# Patient Record
Sex: Female | Born: 1970
Health system: Southern US, Community
[De-identification: ages and names within clinical notes are randomized; demographics above are authoritative.]

## PROBLEM LIST (undated history)

## (undated) DIAGNOSIS — R1013 Epigastric pain: Secondary | ICD-10-CM

## (undated) DIAGNOSIS — I8290 Acute embolism and thrombosis of unspecified vein: Secondary | ICD-10-CM

## (undated) DIAGNOSIS — F419 Anxiety disorder, unspecified: Secondary | ICD-10-CM

## (undated) DIAGNOSIS — R12 Heartburn: Secondary | ICD-10-CM

## (undated) DIAGNOSIS — D509 Iron deficiency anemia, unspecified: Secondary | ICD-10-CM

## (undated) DIAGNOSIS — K8020 Calculus of gallbladder without cholecystitis without obstruction: Secondary | ICD-10-CM

## (undated) DIAGNOSIS — F329 Major depressive disorder, single episode, unspecified: Secondary | ICD-10-CM

## (undated) DIAGNOSIS — F32A Depression, unspecified: Secondary | ICD-10-CM

## (undated) DIAGNOSIS — K219 Gastro-esophageal reflux disease without esophagitis: Secondary | ICD-10-CM

## (undated) DIAGNOSIS — I10 Essential (primary) hypertension: Secondary | ICD-10-CM

## (undated) DIAGNOSIS — R51 Headache: Secondary | ICD-10-CM

## (undated) HISTORY — DX: Gastro-esophageal reflux disease without esophagitis: K21.9

## (undated) HISTORY — PX: COLONOSCOPY: SHX174

## (undated) HISTORY — DX: Iron deficiency anemia, unspecified: D50.9

## (undated) HISTORY — PX: TUBAL LIGATION: SHX77

## (undated) HISTORY — DX: Epigastric pain: R10.13

## (undated) HISTORY — PX: OTHER SURGICAL HISTORY: SHX169

## (undated) HISTORY — DX: Calculus of gallbladder without cholecystitis without obstruction: K80.20

## (undated) HISTORY — DX: Heartburn: R12

---

## 1997-09-24 ENCOUNTER — Inpatient Hospital Stay (HOSPITAL_COMMUNITY): Admission: AD | Admit: 1997-09-24 | Discharge: 1997-09-27 | Payer: Self-pay | Admitting: Obstetrics & Gynecology

## 1998-11-06 ENCOUNTER — Ambulatory Visit (HOSPITAL_COMMUNITY): Admission: RE | Admit: 1998-11-06 | Discharge: 1998-11-06 | Payer: Self-pay | Admitting: *Deleted

## 1999-04-23 ENCOUNTER — Inpatient Hospital Stay (HOSPITAL_COMMUNITY): Admission: AD | Admit: 1999-04-23 | Discharge: 1999-04-23 | Payer: Self-pay | Admitting: *Deleted

## 1999-05-11 ENCOUNTER — Inpatient Hospital Stay (HOSPITAL_COMMUNITY): Admission: AD | Admit: 1999-05-11 | Discharge: 1999-05-13 | Payer: Self-pay | Admitting: *Deleted

## 1999-05-11 ENCOUNTER — Encounter (INDEPENDENT_AMBULATORY_CARE_PROVIDER_SITE_OTHER): Payer: Self-pay

## 1999-12-15 ENCOUNTER — Emergency Department (HOSPITAL_COMMUNITY): Admission: EM | Admit: 1999-12-15 | Discharge: 1999-12-15 | Payer: Self-pay | Admitting: Internal Medicine

## 2000-12-11 ENCOUNTER — Emergency Department (HOSPITAL_COMMUNITY): Admission: EM | Admit: 2000-12-11 | Discharge: 2000-12-11 | Payer: Self-pay | Admitting: Emergency Medicine

## 2002-11-12 ENCOUNTER — Inpatient Hospital Stay (HOSPITAL_COMMUNITY): Admission: EM | Admit: 2002-11-12 | Discharge: 2002-11-16 | Payer: Self-pay | Admitting: Emergency Medicine

## 2002-11-14 ENCOUNTER — Encounter (HOSPITAL_BASED_OUTPATIENT_CLINIC_OR_DEPARTMENT_OTHER): Payer: Self-pay | Admitting: Internal Medicine

## 2003-05-11 ENCOUNTER — Emergency Department (HOSPITAL_COMMUNITY): Admission: EM | Admit: 2003-05-11 | Discharge: 2003-05-11 | Payer: Self-pay

## 2003-08-06 ENCOUNTER — Ambulatory Visit (HOSPITAL_COMMUNITY): Admission: RE | Admit: 2003-08-06 | Discharge: 2003-08-06 | Payer: Self-pay | Admitting: Obstetrics & Gynecology

## 2003-10-07 ENCOUNTER — Emergency Department (HOSPITAL_COMMUNITY): Admission: EM | Admit: 2003-10-07 | Discharge: 2003-10-08 | Payer: Self-pay | Admitting: Emergency Medicine

## 2004-11-12 ENCOUNTER — Inpatient Hospital Stay (HOSPITAL_COMMUNITY): Admission: AD | Admit: 2004-11-12 | Discharge: 2004-11-13 | Payer: Self-pay | Admitting: Obstetrics

## 2004-11-15 ENCOUNTER — Inpatient Hospital Stay (HOSPITAL_COMMUNITY): Admission: AD | Admit: 2004-11-15 | Discharge: 2004-11-15 | Payer: Self-pay | Admitting: Obstetrics

## 2004-11-22 ENCOUNTER — Inpatient Hospital Stay (HOSPITAL_COMMUNITY): Admission: AD | Admit: 2004-11-22 | Discharge: 2004-11-22 | Payer: Self-pay | Admitting: Obstetrics

## 2004-11-25 ENCOUNTER — Inpatient Hospital Stay (HOSPITAL_COMMUNITY): Admission: AD | Admit: 2004-11-25 | Discharge: 2004-11-25 | Payer: Self-pay | Admitting: Obstetrics

## 2004-11-27 ENCOUNTER — Inpatient Hospital Stay (HOSPITAL_COMMUNITY): Admission: AD | Admit: 2004-11-27 | Discharge: 2004-11-29 | Payer: Self-pay | Admitting: Obstetrics

## 2005-03-10 ENCOUNTER — Ambulatory Visit (HOSPITAL_COMMUNITY): Admission: RE | Admit: 2005-03-10 | Discharge: 2005-03-10 | Payer: Self-pay | Admitting: Family Medicine

## 2005-10-30 ENCOUNTER — Inpatient Hospital Stay (HOSPITAL_COMMUNITY): Admission: AD | Admit: 2005-10-30 | Discharge: 2005-10-30 | Payer: Self-pay | Admitting: Obstetrics & Gynecology

## 2006-03-08 ENCOUNTER — Ambulatory Visit (HOSPITAL_COMMUNITY): Admission: RE | Admit: 2006-03-08 | Discharge: 2006-03-08 | Payer: Self-pay | Admitting: Obstetrics and Gynecology

## 2006-06-06 DIAGNOSIS — I8290 Acute embolism and thrombosis of unspecified vein: Secondary | ICD-10-CM

## 2006-06-06 HISTORY — DX: Acute embolism and thrombosis of unspecified vein: I82.90

## 2007-07-28 ENCOUNTER — Emergency Department (HOSPITAL_COMMUNITY): Admission: EM | Admit: 2007-07-28 | Discharge: 2007-07-28 | Payer: Self-pay | Admitting: Emergency Medicine

## 2008-05-07 ENCOUNTER — Emergency Department (HOSPITAL_COMMUNITY): Admission: EM | Admit: 2008-05-07 | Discharge: 2008-05-08 | Payer: Self-pay | Admitting: Emergency Medicine

## 2008-05-08 ENCOUNTER — Encounter (INDEPENDENT_AMBULATORY_CARE_PROVIDER_SITE_OTHER): Payer: Self-pay | Admitting: *Deleted

## 2008-06-06 HISTORY — PX: COLONOSCOPY: SHX174

## 2008-12-09 ENCOUNTER — Encounter (INDEPENDENT_AMBULATORY_CARE_PROVIDER_SITE_OTHER): Payer: Self-pay | Admitting: *Deleted

## 2008-12-09 ENCOUNTER — Emergency Department (HOSPITAL_COMMUNITY): Admission: EM | Admit: 2008-12-09 | Discharge: 2008-12-09 | Payer: Self-pay | Admitting: Emergency Medicine

## 2008-12-19 ENCOUNTER — Emergency Department (HOSPITAL_COMMUNITY): Admission: EM | Admit: 2008-12-19 | Discharge: 2008-12-19 | Payer: Self-pay | Admitting: Emergency Medicine

## 2009-04-08 ENCOUNTER — Encounter (INDEPENDENT_AMBULATORY_CARE_PROVIDER_SITE_OTHER): Payer: Self-pay | Admitting: *Deleted

## 2009-04-17 ENCOUNTER — Ambulatory Visit: Payer: Self-pay | Admitting: Gastroenterology

## 2009-04-17 DIAGNOSIS — K219 Gastro-esophageal reflux disease without esophagitis: Secondary | ICD-10-CM | POA: Insufficient documentation

## 2009-04-23 ENCOUNTER — Telehealth: Payer: Self-pay | Admitting: Gastroenterology

## 2009-04-27 ENCOUNTER — Ambulatory Visit: Payer: Self-pay | Admitting: Gastroenterology

## 2009-05-01 ENCOUNTER — Telehealth: Payer: Self-pay | Admitting: Gastroenterology

## 2009-05-06 ENCOUNTER — Encounter: Payer: Self-pay | Admitting: Gastroenterology

## 2009-05-19 ENCOUNTER — Ambulatory Visit: Payer: Self-pay | Admitting: Gastroenterology

## 2009-06-29 ENCOUNTER — Emergency Department (HOSPITAL_COMMUNITY): Admission: EM | Admit: 2009-06-29 | Discharge: 2009-06-30 | Payer: Self-pay | Admitting: Emergency Medicine

## 2009-07-21 ENCOUNTER — Telehealth: Payer: Self-pay | Admitting: Gastroenterology

## 2009-07-22 ENCOUNTER — Ambulatory Visit: Payer: Self-pay | Admitting: Gastroenterology

## 2009-07-22 DIAGNOSIS — R1012 Left upper quadrant pain: Secondary | ICD-10-CM | POA: Insufficient documentation

## 2009-08-19 ENCOUNTER — Inpatient Hospital Stay (HOSPITAL_COMMUNITY): Admission: AD | Admit: 2009-08-19 | Discharge: 2009-08-19 | Payer: Self-pay | Admitting: Obstetrics & Gynecology

## 2009-10-19 ENCOUNTER — Telehealth: Payer: Self-pay | Admitting: Gastroenterology

## 2009-10-22 ENCOUNTER — Ambulatory Visit: Payer: Self-pay | Admitting: Gastroenterology

## 2009-10-22 DIAGNOSIS — D509 Iron deficiency anemia, unspecified: Secondary | ICD-10-CM | POA: Insufficient documentation

## 2009-10-23 ENCOUNTER — Telehealth: Payer: Self-pay | Admitting: Gastroenterology

## 2009-10-28 ENCOUNTER — Ambulatory Visit (HOSPITAL_COMMUNITY): Admission: RE | Admit: 2009-10-28 | Discharge: 2009-10-28 | Payer: Self-pay | Admitting: Gastroenterology

## 2009-11-03 ENCOUNTER — Ambulatory Visit: Payer: Self-pay | Admitting: Gastroenterology

## 2009-11-18 ENCOUNTER — Telehealth: Payer: Self-pay | Admitting: Gastroenterology

## 2009-11-25 ENCOUNTER — Telehealth: Payer: Self-pay | Admitting: Gastroenterology

## 2010-06-09 ENCOUNTER — Ambulatory Visit (HOSPITAL_COMMUNITY)
Admission: RE | Admit: 2010-06-09 | Discharge: 2010-06-09 | Payer: Self-pay | Source: Home / Self Care | Attending: Nephrology | Admitting: Nephrology

## 2010-06-23 ENCOUNTER — Telehealth: Payer: Self-pay | Admitting: Gastroenterology

## 2010-06-23 ENCOUNTER — Ambulatory Visit: Admit: 2010-06-23 | Payer: Self-pay | Admitting: Gastroenterology

## 2010-07-06 NOTE — Progress Notes (Signed)
Summary: TRIAGE-LUQ Pain/GERD  Phone Note Call from Patient Call back at Home Phone [phone]   Caller: Patient Call For: Arlyce Dice Reason for Call: Talk to Nurse Summary of Call: Patient has alot of pain on her left side under her breast, thinks that she might have gallstone wants to know what to do. Initial call taken by: Tawni Levy,  July 21, 2009 9:52 AM  Follow-up for Phone Call        Pt. c/o intermittent LUQ pain, gets worse at times. Denies n/v, fever. Takes Nexium daily, has heartburn daily, seems to be getting worse. Pt. will see Dr.Kaoir Loree on 07-22-09 at 8:30am.  Follow-up by: Laureen Ochs LPN,  July 21, 2009 10:07 AM

## 2010-07-06 NOTE — Progress Notes (Signed)
Summary: TRIAGE-Med Change  Phone Note Call from Patient Call back at Home Phone [phone]   Caller: Patient Call For: Dr. Arlyce Dice Reason for Call: Talk to Nurse Summary of Call: pt is wanting to discuss her Nexium med....and maybe changing the med to an alternative Initial call taken by: Karna Christmas,  November 25, 2009 10:51 AM  Follow-up for Phone Call        Message left for patient to callback. Laureen Ochs LPN  November 25, 2009 12:53 PM  Pt. states her insurance won't cover Nexium any longer, Omeprazole 40mg  once daily sent to her pharmacy.  Pt. instructed to call back as needed.  Follow-up by: Laureen Ochs LPN,  November 25, 2009 1:06 PM  Additional Follow-up for Phone Call Additional follow up Details #1::        ok Additional Follow-up by: Louis Meckel MD,  November 26, 2009 10:19 AM    New/Updated Medications: OMEPRAZOLE 40 MG  CPDR (OMEPRAZOLE) 1 each day 30 minutes before breakfast. Prescriptions: OMEPRAZOLE 40 MG  CPDR (OMEPRAZOLE) 1 each day 30 minutes before breakfast.  #30 x 12   Entered by:   Laureen Ochs LPN   Authorized by:   Louis Meckel MD   Signed by:   Laureen Ochs LPN on 09/81/1914   Method used:   [facility]  [address] [phone]* (retail)       [address]       [place], [place]  [address]       Ph: [phone]       Fax: [phone]   RxID:   [id number]

## 2010-07-06 NOTE — Progress Notes (Signed)
Summary: TRIAGE  Phone Note Call from Patient Call back at Home Phone [phone]   Caller: Patient Call For: Arlyce Dice Reason for Call: Talk to Nurse Summary of Call: Patient states that she has a lot of chest pain and burning and states that Nexium is not helping, wants to be seen before frist available appt 6-15. Initial call taken by: Tawni Levy,  Oct 19, 2009 10:42 AM  Follow-up for Phone Call        Last OV 07-22-09. Takes Nexium 2 QAM. C/O increasing GERD and burning, worse at night.  Was seen in the ER last month and was given Hyoscyamine 0.125mg  as needed, helps somewhat. Wants to be ASAP.  1) See Dr.Kaplan on 10-22-09 at 9:30am 2) Take Nexium two times a day until appt., not at the same time.  3) Continue Hyomax 4) Soft,bland diet. No spicy,greasy,fried foods.  5) Pt. instructed to call back as needed.  Follow-up by: Laureen Ochs LPN,  Oct 19, 2009 11:15 AM

## 2010-07-06 NOTE — Assessment & Plan Note (Signed)
Summary: WORSENING LUQ PAIN AND GERD             [name]   History of Present Illness Visit Type: Follow-up Visit Primary GI MD: Melvia Heaps MD [facility] Primary Provider: n/a Requesting Provider: n/a Chief Complaint: Worsened LUQ pain and GERD, pt states she sometimes will take the Nexium bid but it seems that Alka Seltzer plus helps more than anything History of Present Illness:   [name] left upper quadrant pain. she has developed aching left upper quadrant pain without radiation that  may occur in the evenings.  Extra Nexium did not help.  Upper endoscopy demonstrated mild esophagitis.  Colonoscopy was negative.   GI Review of Systems    Reports abdominal pain and  acid reflux.     Location of  Abdominal pain: LUQ.    Denies belching, bloating, chest pain, dysphagia with liquids, dysphagia with solids, heartburn, loss of appetite, nausea, vomiting, vomiting blood, weight loss, and  weight gain.        Denies anal fissure, black tarry stools, change in bowel habit, constipation, diarrhea, diverticulosis, fecal incontinence, heme positive stool, hemorrhoids, irritable bowel syndrome, jaundice, light color stool, liver problems, rectal bleeding, and  rectal pain.    Current Medications (verified): 1)  Nexium 40 Mg Capdr (Esomeprazole Magnesium) .... Take One (1) By Mouth Once A Day 2)  Alka-Seltzer Extra Strength 500-1-1.985 Mg-Gm-Gm Tbef (Aspirin Effervescent) .... As Needed  Allergies (verified): No Known Drug Allergies  Past History:  Past Medical History: Last updated: 04/17/2009 Epigastric Abdominal Pain Heartburn  Past Surgical History: Last updated: 04/17/2009 unremarkable  Family History: Last updated: 04/17/2009 Family History of Diabetes: Mother Family History of Colon Cancer: Father???  Social History: Last updated: 04/17/2009 Patient has never smoked. Daily Caffeine Use-1 drink daily Illicit Drug Use - no Patient does not get regular exercise.  Alcohol  Use - yes-very occasional  Review of Systems  The patient denies allergy/sinus, anemia, anxiety-new, arthritis/joint pain, back pain, blood in urine, breast changes/lumps, change in vision, confusion, cough, coughing up blood, depression-new, fainting, fatigue, fever, headaches-new, hearing problems, heart murmur, heart rhythm changes, itching, menstrual pain, muscle pains/cramps, night sweats, nosebleeds, pregnancy symptoms, shortness of breath, skin rash, sleeping problems, sore throat, swelling of feet/legs, swollen lymph glands, thirst - excessive , urination - excessive , urination changes/pain, urine leakage, vision changes, and voice change.         Right Shoulder pain URI  Vital Signs:  Patient profile:   40 year old female Height:      63 inches Weight:      201 pounds BMI:     35.73 BSA:     1.94 Pulse rate:   88 / minute Pulse rhythm:   regular BP sitting:   118 / 74  (left arm)  Vitals Entered By: Merri Ray CMA Duncan Dull) (July 22, 2009 8:50 AM)   Impression & Recommendations:  Problem # 1:  ABDOMINAL PAIN, LEFT UPPER QUADRANT (ICD-789.02) Symptoms are not specific.  Recommendations #1 polyp hyomax sublingual p.r.n. #2 continue Nexium  Problem # 2:  FM HX MALIGNANT NEOPLASM GASTROINTESTINAL TRACT (ICD-V16.0) Plan repeat colonoscopy in 5 years  Problem # 3:  ESOPHAGEAL REFLUX (ICD-530.81) Assessment: Improved  Appended Document: WORSENING LUQ PAIN AND GERD             [name]    Clinical Lists Changes  Medications: Added new medication of HYOMAX-SL 0.125 MG SUBL (HYOSCYAMINE SULFATE) 2 SL every 4 hours as needed - Signed Rx  of HYOMAX-SL 0.125 MG SUBL (HYOSCYAMINE SULFATE) 2 SL every 4 hours as needed;  #25 x 2;  Signed;  Entered by: Merri Ray CMA (AAMA);  Authorized by: Louis Meckel MD;  Method used: [facility] [phone]*, [address] [place], [place], [place]  [address], Ph: [phone], Fax:  [phone]    Prescriptions: HYOMAX-SL 0.125 MG SUBL (HYOSCYAMINE SULFATE) 2 SL every 4 hours as needed  #25 x 2   Entered by:   Merri Ray CMA (AAMA)   Authorized by:   Louis Meckel MD   Signed by:   Merri Ray CMA (AAMA) on 07/22/2009   Method used:   [facility]        [facility]  [address] [phone]* (retail)       [address]       [place], [place]  [address]       Ph: [phone]       Fax: [phone]   RxID:   [id number]

## 2010-07-06 NOTE — Assessment & Plan Note (Signed)
Summary: WORSENING GERD                   [name]   History of Present Illness Visit Type: Follow-up Visit Primary GI MD: Melvia Heaps MD [facility] Primary Provider: na Requesting Provider: n/a Chief Complaint: Worsening GERD History of Present Illness:   [name] has returned for followup of her abdominal pain.  She is having very frequent episodes of moderately severe aching upper abdominal pain that tends to occur on the left breast and in the subxiphoid area.  Pain is without radiation.  It is not necessarily related to eating.  She is taking Nexium daily.  She reports some improvement in her pain she takes up to 3 sublingual hyomax at a time.  He is without nausea, pyrosis or vomiting.  She was seen was along DR in March, 2000 and because of pain.  Lab was pertinent for hemoglobin of 6.5 and MCV of 55.  She denies menorrhagia.  Distal history of melena or hematochezia.  Liver tests are normal.  A vaginal ultrasound demonstrated prominent follicles.  Pregnancy test was negative.  She is on no gastric irritants.   GI Review of Systems    Reports acid reflux and  heartburn.      Denies abdominal pain, belching, bloating, chest pain, dysphagia with liquids, dysphagia with solids, loss of appetite, nausea, vomiting, vomiting blood, weight loss, and  weight gain.        Denies anal fissure, black tarry stools, change in bowel habit, constipation, diarrhea, diverticulosis, fecal incontinence, heme positive stool, hemorrhoids, irritable bowel syndrome, jaundice, light color stool, liver problems, rectal bleeding, and  rectal pain.    Current Medications (verified): 1)  Nexium 40 Mg Capdr (Esomeprazole Magnesium) .... Take One (1) By Mouth Once A Day 2)  Alka-Seltzer Extra Strength 500-1-1.985 Mg-Gm-Gm Tbef (Aspirin Effervescent) .... As Needed 3)  Hyomax-Sl 0.125 Mg Subl (Hyoscyamine Sulfate) .... 2 Sl Every 4 Hours As Needed  Allergies (verified): No Known Drug Allergies  Past  History:  Past Medical History: Reviewed history from 04/17/2009 and no changes required. Epigastric Abdominal Pain Heartburn  Past Surgical History: Reviewed history from 04/17/2009 and no changes required. unremarkable  Family History: Reviewed history from 04/17/2009 and no changes required. Family History of Diabetes: Mother Family History of Colon Cancer: Father???  Social History: Medication Aid---CNA  Married  Patient has never smoked. Daily Caffeine Use-1 drink daily Illicit Drug Use - no Patient does not get regular exercise.  Alcohol Use - yes-very occasional  Review of Systems       The patient complains of shortness of breath.  The patient denies allergy/sinus, anemia, anxiety-new, arthritis/joint pain, back pain, blood in urine, breast changes/lumps, change in vision, confusion, cough, coughing up blood, depression-new, fainting, fatigue, fever, headaches-new, hearing problems, heart murmur, heart rhythm changes, itching, menstrual pain, muscle pains/cramps, night sweats, nosebleeds, pregnancy symptoms, skin rash, sleeping problems, sore throat, swelling of feet/legs, swollen lymph glands, thirst - excessive , urination - excessive , urination changes/pain, urine leakage, vision changes, and voice change.    Vital Signs:  Patient profile:   40 year old female Height:      63 inches Weight:      197 pounds BMI:     35.02 BSA:     1.92 Pulse rate:   96 / minute Pulse rhythm:   regular BP sitting:   122 / 76  (left arm) Cuff size:   large  Vitals Entered By: Ok Anis  CMA (Oct 22, 2009 10:09 AM)  Physical Exam  Additional Exam:  On physical exam she is a well-developed well-nourished female  On abdominal exam there are no abdominal masses, tenderness, organomegaly   Impression & Recommendations:  Problem # 1:  ABDOMINAL PAIN, LEFT UPPER QUADRANT (ICD-789.02)  Etiology of her pain is still uncertain.  I think it is unlikely that it is due to reflux.   She seems to respond, at least in part, to anticholinergics.  Esophageal spasm and, less likely, chronic cholecystitis are possibilities.  Recommendations #1 abdominal ultrasound #2 switch hyomax 2.375 mg b.i.d. #3 to consider hepatobiliary scan pending results of ultrasound #4 possible suction manometry pending results of the above  Orders: Ultrasound Abdomen (UAS)  Problem # 2:  IRON DEFICIENCY (ICD-280.9)  While this could be due to chronic menstrual blood loss, occult GI bleeding needs to be ruled out.  Conditions #1 stool Hemoccults #2 begin iron supplementation  Orders: Ultrasound Abdomen (UAS)  Patient Instructions: 1)  Your Abdominal U/S is scheduled for 10/28/2009 at 9am at [facility] 2)  We are giving you printed prescriptions today 3)  Begin Iron Supplements 4)  The medication list was reviewed and reconciled.  All changed / newly prescribed medications were explained.  A complete medication list was provided to the patient / caregiver. Prescriptions: FEOSOL 200 (65 FE) MG TABS (FERROUS SULFATE DRIED) takes one tab b.i.d.  #60 x 2   Entered by:   Merri Ray CMA (AAMA)   Authorized by:   Louis Meckel MD   Signed by:   Merri Ray CMA (AAMA) on 10/22/2009   Method used:   Faxed to ...       [facility] (retail)       [phone] [address]       [place], [place]  [address]       Ph: [phone]       Fax: [phone]   RxID:   [id number] HYOMAX-SR 0.375 MG XR12H-TAB (HYOSCYAMINE SULFATE) take one tab b.i.d.  #40 x 2   Entered by:   Merri Ray CMA (AAMA)   Authorized by:   Louis Meckel MD   Signed by:   Merri Ray CMA (AAMA) on 10/22/2009   Method used:   Faxed to ...       [facility] (retail)       [id number]       [place], [place]  [address]       Ph: [phone]       Fax: [phone]   RxID:   [id number]

## 2010-07-06 NOTE — Letter (Signed)
Summary: [facility]: Immunoassay Fecal Occult Blood (iFOB) Order [facility]  [address] [place], [place] [address]   Phone: [phone]  Fax: [phone]      [place] [facility]: Immunoassay Fecal Occult Blood (iFOB) Order Form   Oct 22, 2009 MRN: [id number]   [facility] 04-Sep-1970   Physicican Name:robert kaplan  Diagnosis Code:anemia  280.9      Merri Ray CMA (AAMA)

## 2010-07-06 NOTE — Progress Notes (Signed)
Summary: Condition Update  Phone Note Call from Patient Call back at Home Phone [phone]   Caller: Patient Call For: Dr. Arlyce Dice Reason for Call: Lab or Test Results Summary of Call: pt is calling about her ultrasound results Initial call taken by: Karna Christmas,  November 18, 2009 12:30 PM  Follow-up for Phone Call        Ultrasound results reviewed w/pt. Pt. states her pain has almost resolved, she continues Hyomax two times a day and Nexium once daily.  Pt. advised to call back for any changes or worsening symptoms.  DR.Sterlin Knightly-Any further orders? When does she need to see you again?  Follow-up by: Laureen Ochs LPN,  November 18, 2009 1:39 PM  Additional Follow-up for Phone Call Additional follow up Details #1::        OV as needed if she continues to respond to hyomax. Additional Follow-up by: Louis Meckel MD,  November 18, 2009 4:07 PM    Additional Follow-up for Phone Call Additional follow up Details #2::    Message left for pt. with above MD instructions. Pt. instructed to call back as needed.  Follow-up by: Laureen Ochs LPN,  November 18, 2009 4:27 PM

## 2010-07-06 NOTE — Progress Notes (Signed)
Summary: med request  Phone Note Call from Patient Call back at Home Phone [phone]   Caller: Patient Call For: Dr. Arlyce Dice Reason for Call: Refill Medication Summary of Call: states that Hyomax and iron pills [facility] not at [facility] [address] Initial call taken by: Vallarie Mare,  Oct 23, 2009 2:35 PM  Follow-up for Phone Call        Called pt to inform that [facility] [address] L/M on pts answering machine Follow-up by: Merri Ray CMA Duncan Dull),  Oct 23, 2009 3:01 PM    Prescriptions: FEOSOL 200 (65 FE) MG TABS (FERROUS SULFATE DRIED) takes one tab b.i.d.  #60 x 2   Entered by:   Merri Ray CMA (AAMA)   Authorized by:   Louis Meckel MD   Signed by:   Merri Ray CMA (AAMA) on 10/23/2009   Method used:   [facility]        [facility]  [address] [phone]* (retail)       [address]       [place], [place]  [address]       Ph: [phone]       Fax: [phone]   RxID:   [id number] HYOMAX-SR 0.375 MG XR12H-TAB (HYOSCYAMINE SULFATE) take one tab b.i.d.  #60 x 2   Entered by:   Merri Ray CMA (AAMA)   Authorized by:   Louis Meckel MD   Signed by:   Merri Ray CMA (AAMA) on 10/23/2009   Method used:   [facility]        [facility]  [address] [phone]* (retail)       [address]       [place], [place]  [address]       Ph: [phone]       Fax: [phone]   RxID:   [id number]

## 2010-07-08 NOTE — Progress Notes (Signed)
Summary: Triage  Phone Note Call from Patient Call back at Home Phone [phone]   Caller: Patient Call For: Dr. Arlyce Dice Reason for Call: Talk to Nurse Summary of Call: Pt says she is hurting and medication is not helping, wants to be seen ASAP but does not want to miss work so she wants [place] to work her in  Initial call taken by: [place] Johnson,  June 23, 2010 10:08 AM  Follow-up for Phone Call        Pt called to let us know that she can not make it today to see PA at 1:30, just letting us know Follow-up by: [place] Johnson,  June 23, 2010 12:38 PM  Additional Follow-up for Phone Call Additional follow up Details #1::        Patient states she will call back to schedule an appointment. Additional Follow-up by: Selinda Michaels RN,  June 23, 2010 1:00 PM

## 2010-08-08 ENCOUNTER — Emergency Department (HOSPITAL_COMMUNITY)
Admission: EM | Admit: 2010-08-08 | Discharge: 2010-08-08 | Disposition: A | Discharge status: Home / Self Care | Payer: BC Managed Care – PPO | Attending: Emergency Medicine | Admitting: Emergency Medicine

## 2010-08-08 DIAGNOSIS — N390 Urinary tract infection, site not specified: Secondary | ICD-10-CM | POA: Insufficient documentation

## 2010-08-08 DIAGNOSIS — D649 Anemia, unspecified: Secondary | ICD-10-CM | POA: Insufficient documentation

## 2010-08-08 LAB — DIFFERENTIAL
Eosinophils Absolute: 0.1 10*3/uL (ref 0.0–0.7)
Eosinophils Relative: 1 % (ref 0–5)
Monocytes Absolute: 0.7 10*3/uL (ref 0.1–1.0)
Neutrophils Relative %: 63 % (ref 43–77)

## 2010-08-08 LAB — CBC
Platelets: 427 10*3/uL — ABNORMAL HIGH (ref 150–400)
RDW: 19.6 % — ABNORMAL HIGH (ref 11.5–15.5)
WBC: 10.5 10*3/uL (ref 4.0–10.5)

## 2010-08-08 LAB — URINALYSIS, ROUTINE W REFLEX MICROSCOPIC
Bilirubin Urine: NEGATIVE
Ketones, ur: NEGATIVE mg/dL
Nitrite: NEGATIVE
pH: 6 (ref 5.0–8.0)

## 2010-08-08 LAB — URINE MICROSCOPIC-ADD ON

## 2010-08-08 LAB — BASIC METABOLIC PANEL
GFR calc Af Amer: >60 mL/min (ref >60)
GFR calc non Af Amer: >60 mL/min (ref >60)
Potassium: 3.9 mEq/L (ref 3.5–5.1)
Sodium: 136 mEq/L (ref 135–145)

## 2010-08-29 LAB — URINALYSIS, ROUTINE W REFLEX MICROSCOPIC
Nitrite: NEGATIVE
Specific Gravity, Urine: 1.01 (ref 1.005–1.030)
pH: 7 (ref 5.0–8.0)

## 2010-08-29 LAB — COMPREHENSIVE METABOLIC PANEL
ALT: 13 U/L (ref 0–35)
AST: 19 U/L (ref 0–37)
Albumin: 3.6 g/dL (ref 3.5–5.2)
CO2: 23 mEq/L (ref 19–32)
Calcium: 8.9 mg/dL (ref 8.4–10.5)
Chloride: 103 mEq/L (ref 96–112)
GFR calc Af Amer: >60 mL/min (ref >60)
GFR calc non Af Amer: >60 mL/min (ref >60)
Sodium: 133 mEq/L — ABNORMAL LOW (ref 135–145)

## 2010-08-29 LAB — CBC
MCHC: 29.3 g/dL — ABNORMAL LOW (ref 30.0–36.0)
Platelets: 309 10*3/uL (ref 150–400)
RBC: 4.05 MIL/uL (ref 3.87–5.11)
WBC: 8.8 10*3/uL (ref 4.0–10.5)

## 2010-09-12 LAB — CULTURE, ROUTINE-ABSCESS: Gram Stain: NONE SEEN

## 2010-09-12 LAB — POCT I-STAT, CHEM 8
Calcium, Ion: 1.12 mmol/L (ref 1.12–1.32)
Hemoglobin: 9.9 g/dL — ABNORMAL LOW (ref 12.0–15.0)
Sodium: 138 mEq/L (ref 135–145)
TCO2: 23 mmol/L (ref 0–100)

## 2010-09-12 LAB — URINE MICROSCOPIC-ADD ON

## 2010-09-12 LAB — URINALYSIS, ROUTINE W REFLEX MICROSCOPIC
Glucose, UA: NEGATIVE mg/dL
Specific Gravity, Urine: 1.015 (ref 1.005–1.030)
pH: 5 (ref 5.0–8.0)

## 2010-09-12 LAB — PREGNANCY, URINE: Preg Test, Ur: NEGATIVE

## 2010-09-12 LAB — KETONES, QUALITATIVE: Acetone, Bld: NEGATIVE

## 2010-09-27 ENCOUNTER — Other Ambulatory Visit: Payer: Self-pay | Admitting: Gastroenterology

## 2010-09-29 ENCOUNTER — Telehealth: Payer: Self-pay | Admitting: Gastroenterology

## 2010-09-29 NOTE — Telephone Encounter (Signed)
Left message for pt to call back  °

## 2010-10-01 NOTE — Telephone Encounter (Signed)
Pt states she is having trouble with epigastric pain, she reports the pain is right under her rib cage. She wonders if she has a hiatal hernia. Would like to be seen. Appt given for pt to see Amy Esterwood PA 10/04/10@1 :30pm. Pt aware of appt date and time.

## 2010-10-04 ENCOUNTER — Other Ambulatory Visit (INDEPENDENT_AMBULATORY_CARE_PROVIDER_SITE_OTHER): Payer: BC Managed Care – PPO

## 2010-10-04 ENCOUNTER — Encounter: Payer: Self-pay | Admitting: Physician Assistant

## 2010-10-04 ENCOUNTER — Ambulatory Visit (INDEPENDENT_AMBULATORY_CARE_PROVIDER_SITE_OTHER): Payer: BC Managed Care – PPO | Admitting: Physician Assistant

## 2010-10-04 VITALS — BP 128/76 | HR 88 | Ht 62.0 in | Wt 186.0 lb

## 2010-10-04 DIAGNOSIS — R1013 Epigastric pain: Secondary | ICD-10-CM

## 2010-10-04 DIAGNOSIS — R1011 Right upper quadrant pain: Secondary | ICD-10-CM

## 2010-10-04 DIAGNOSIS — K219 Gastro-esophageal reflux disease without esophagitis: Secondary | ICD-10-CM

## 2010-10-04 DIAGNOSIS — K8020 Calculus of gallbladder without cholecystitis without obstruction: Secondary | ICD-10-CM

## 2010-10-04 LAB — HEPATIC FUNCTION PANEL
AST: 17 U/L (ref 0–37)
Albumin: 3.3 g/dL — ABNORMAL LOW (ref 3.5–5.2)
Total Bilirubin: 0.6 mg/dL (ref 0.3–1.2)

## 2010-10-04 NOTE — Progress Notes (Signed)
  Subjective:    Patient ID: [name], female    DOB: [birth year], 40 y.o.   MRN: [id number]  HPI [name] is a pleasant 40 year old African American female known to Dr. Arlyce Dice who has a diagnosis of GERD. She also had colonoscopy in 2010 which was a normal exam. She was last seen in January of 2012 at that time complaining of reflux symptoms and left upper quadrant pain. She has been on omeprazole 40 mg daily and has been using high max as needed.  She comes in today with complaints of persistent intermittent epigastric and right upper quadrant pain. She says this is the same symptoms that she's been having over the past several months. Her pain is not constant constant but rather comes and goes and often awakens her at night. She says when she gets the pain at night she may be up for a couple of hours. She describes it as sharp and nonradiating not associated with any nausea or vomiting. She is using ibuprofen for this pain as needed and also uses hyomax which she feels that does help eventually. She is not having any fever or chills says that her bowel movements are normal. She also has episodes during the day of epigastric pain usually exacerbated by chocolate ,cheese and greasy foods. Appetite is times he feels that she's having episodes of pain 3-4 times weekly. She also has some heartburn and indigestion denies any dysphagia or odynophagia, she has lost a little weight but this has been intentional.  A review of a prior workup shows abdominal ultrasound done in May of 2011 showing several small gallstones common bile duct of 6 mm. She had an EGD in November of 2010 pertinent for mild reflux esophagitis and gastritis H. pylori negative.    Review of Systems  Constitutional: Negative.   Eyes: Negative.   Respiratory: Negative.   Cardiovascular: Negative.   Gastrointestinal: Positive for abdominal pain.  Genitourinary: Negative.   Musculoskeletal: Negative.   Skin: Negative.     Neurological: Negative.   Hematological: Negative.   Psychiatric/Behavioral: Negative.        Objective:   Physical Exam Well-developed African American female in no acute distress pleasant Alert and oriented x3,  HEENT ;nontraumatic normocephalic EOMI PERRLA sclera anicteric  Neck; supple no JVD  Cardiovascular; regular rate and rhythm with S1-S2 no murmur rub or gallop  Pulmonary; clear bilaterally  Abdomen; soft, nontender, bowel sounds active, no palpable masses or hepatosplenomegaly  Recta;l not done  Skin; warm and dry benign  Psych; mood and affect normal and appropriate        Assessment & Plan:  #16 40 year old female with recurrent frequent episodes of epigastric/right upper quadrant pain times several months, no change with current PPI therapy Suspect her symptoms may be due to symptomatic cholelithiasis/biliary colic.  Plan; Continue omeprazole 40 mg but increase to twice daily dosing in the short-term Check hepatic panel today, patient had an episode of pain  24 hours ago Repeat upper abdominal ultrasound to assess common bile duct wall thickness etc. Discussed surgical referral for laparoscopic cholecystectomy, patient would like to wait for the ultrasound results prior to scheduling.

## 2010-10-04 NOTE — Patient Instructions (Signed)
Please go to the basement level to have your labs drawn.  We have scheduled the Ultrasound at [facility]. Directions and date and time provided. Take the Omeprazole ( Prilosec) twice daily for 14 days then go back to once daily. We will call you with the lab and ultrasound results.

## 2010-10-05 ENCOUNTER — Telehealth: Payer: Self-pay | Admitting: *Deleted

## 2010-10-05 NOTE — Telephone Encounter (Signed)
Patient given lab results as per Amy Esterwood, PA 

## 2010-10-05 NOTE — Telephone Encounter (Signed)
Message copied by Jesse Fall on Tue Oct 05, 2010  2:02 PM ------      Message from: [place], [place]      Created: Tue Oct 05, 2010  1:31 PM       Please let pt know her labs are normal

## 2010-10-06 NOTE — Progress Notes (Signed)
I agree with the management per Ms. Esterwood.  Iva Boop, MD, [facility]

## 2010-10-07 ENCOUNTER — Ambulatory Visit (HOSPITAL_COMMUNITY)
Admission: RE | Admit: 2010-10-07 | Discharge: 2010-10-07 | Disposition: A | Discharge status: Home / Self Care | Payer: BC Managed Care – PPO | Source: Ambulatory Visit | Attending: Physician Assistant | Admitting: Physician Assistant

## 2010-10-07 DIAGNOSIS — R110 Nausea: Secondary | ICD-10-CM | POA: Insufficient documentation

## 2010-10-07 DIAGNOSIS — R1011 Right upper quadrant pain: Secondary | ICD-10-CM

## 2010-10-07 DIAGNOSIS — K8020 Calculus of gallbladder without cholecystitis without obstruction: Secondary | ICD-10-CM | POA: Insufficient documentation

## 2010-10-07 DIAGNOSIS — K219 Gastro-esophageal reflux disease without esophagitis: Secondary | ICD-10-CM

## 2010-10-07 DIAGNOSIS — R109 Unspecified abdominal pain: Secondary | ICD-10-CM | POA: Insufficient documentation

## 2010-10-07 DIAGNOSIS — R1013 Epigastric pain: Secondary | ICD-10-CM

## 2010-10-08 ENCOUNTER — Telehealth: Payer: Self-pay

## 2010-10-08 NOTE — Telephone Encounter (Signed)
Spoke with pt and she is aware of results per Mike Gip PA. Called [facility] and left message for [facility] to call back about referral for lap chole.

## 2010-10-08 NOTE — Telephone Encounter (Signed)
Appt made for pt to see Dr. Luretha Murphy 10/14/10. Arrival time 1015, appt time 10:35am. Pt aware of appt date and time but wanted to know if we could do anything on a Wed. Pt given the office phone number for Dr. Daphine Deutscher to see if she could change her appt date. Records faxed to Dr. Daphine Deutscher.

## 2010-10-08 NOTE — Telephone Encounter (Signed)
Message copied by Chrystie Nose on Fri Oct 08, 2010  9:05 AM ------      Message from: [place], [place]      Created: Thu Oct 07, 2010  4:41 PM       Please let pt know her US shows multiple gallstones,slightly dilated bile duct. I  Think she needs surgical referral for lap chole. PLEASE SCHEDULE IF PT OK GOING AHEAD. WE DISCUSSED AT HER OFFICE VISIT, AND SHE WANTED TO WAIT TO SEE WHAT THE US SHOWED., THANKS AMY

## 2010-10-18 ENCOUNTER — Encounter (HOSPITAL_COMMUNITY): Payer: BC Managed Care – PPO

## 2010-10-21 ENCOUNTER — Ambulatory Visit (HOSPITAL_COMMUNITY)
Admission: RE | Admit: 2010-10-21 | Discharge: 2010-10-21 | Disposition: A | Discharge status: Home / Self Care | Payer: BC Managed Care – PPO | Source: Ambulatory Visit | Attending: Surgery | Admitting: Surgery

## 2010-10-21 DIAGNOSIS — K8020 Calculus of gallbladder without cholecystitis without obstruction: Secondary | ICD-10-CM | POA: Insufficient documentation

## 2010-10-21 DIAGNOSIS — Z01810 Encounter for preprocedural cardiovascular examination: Secondary | ICD-10-CM | POA: Insufficient documentation

## 2010-10-21 DIAGNOSIS — I803 Phlebitis and thrombophlebitis of lower extremities, unspecified: Secondary | ICD-10-CM

## 2010-10-21 DIAGNOSIS — Z86718 Personal history of other venous thrombosis and embolism: Secondary | ICD-10-CM

## 2010-10-22 NOTE — H&P (Signed)
[facility]  PatientSYLVANNA Solomon                      MRN: [id number] Proc. Date: 05/12/99 Adm. Date:  [id number] Attending:  Deniece Ree                         History and Physical  HISTORY OF PRESENT ILLNESS:   The patient is a 40 year old, gravida 3, para 3, status post normal spontaneous vaginal delivery of a female infant.  The patient has been desirous of permanent sterilization throughout this pregnancy.  All of her questions were answered to her satisfaction.  Other types of contraceptives methods that are reversible have been given to this patient who does not desire to continue in that route.  She understands the possible complications.  She also understands that this procedure is intended to be permanent, however, cannot be guaranteed.  PHYSICAL EXAMINATION:  GENERAL:                      Revealed a well-developed, well-nourished postpartum female in no acute distress.  HEENT:                        Within normal limits.  NECK:                         Supple.  BREASTS:                      Without masses, tenderness, or discharge.  LUNGS:                        Clear to auscultation and percussion.  HEART:                        Normal sinus rhythm without murmurs, rubs, or gallops.  ABDOMEN:                      Benign with uterine fundus extending to the umbilicus.  EXTREMITIES: NEUROLOGICAL:    Within normal limits.  PELVIC:                       Not done.  ADMISSION DIAGNOSES:          Multiparity, desirous of permanent sterilization.  PLAN:                         Bilateral tubal ligation using the modified Pomeroy procedure. DD:  05/12/99 TD:  05/13/99 Job: [id number]

## 2010-10-22 NOTE — Discharge Summary (Signed)
[name], [name]                        ACCOUNT NO.:  [id number]   MEDICAL RECORD NO.:  [id number]                   PATIENT TYPE:  INP   LOCATION:  0473                                 FACILITY:  [facility]   PHYSICIAN:  Barry Dienes. Eloise Harman, M.D.            DATE OF BIRTH:  05/15/1971   DATE OF ADMISSION:  11/12/2002  DATE OF DISCHARGE:  11/16/2002                                 DISCHARGE SUMMARY   PERTINENT FINDINGS:  The patient is a 40 year old black female with a one-  month history of swelling and pain in the right lower extremity.  She denied  shortness of breath or fever.  She has no personal or family history of deep  venous thrombosis or pulmonary embolism.  She works as a Electronics engineer.  She denies injury to her right leg.  She had a recent three-hour  car drive approximately two weeks prior to admission.   PAST MEDICAL HISTORY:  Significant for scoliosis, a clinical diagnosis of  gastroesophageal reflux disease, and chronic common migraines.   MEDICATIONS PRIOR TO ADMISSION:  1. Tylenol as needed.  2. Nexium 40 mg once daily as needed for reflux symptoms.  3  Aleve as needed for headache.   ALLERGIES:  No known drug allergies.   PAST SURGICAL HISTORY:  None.   FAMILY HISTORY:  Father died at age 70 of cancer of uncertain type.  Mother  is age 27 and has diabetes mellitus type 2 and hypertension.  She has a  brother age 20 who is alive and well.  She has three daughters ages 50  years, eight years, and three years.   SOCIAL HISTORY:  She is married and has no history of tobacco or alcohol  abuse.  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   REVIEW OF SYSTEMS:  She has mild headaches approximately four days per week  that are relieved with Aleve treatment.  She drinks approximately two cups  of coffee daily.  She denies shortness of breath or chest pain.  She denies  constipation, diarrhea, or  melena.   PHYSICAL EXAMINATION:  VITAL SIGNS:  Blood pressure 131/83, pulse 85,  respiratory rate 18, temperature 97.6, weight 214 pounds, height 63 inches.  GENERAL:  She is a mildly-overweight black female in no apparent distress.  HEENT:  Within normal limits.  NECK:  Supple without jugular venous distention.  CHEST:  Clear to auscultation.  HEART:  Regular rate and rhythm, S1 and S2 were present.  ABDOMEN:  Normal bowel sounds without hepatosplenomegaly or tenderness.  EXTREMITIES:  Without edema.  There is tenderness in the proximal right  calf.  NEUROLOGIC:  She is alert and oriented x3.  Cranial nerves II-XII are  normal.  Sensory exam and motor strength were normal.  Gait was not assessed  at this time.   LABORATORY STUDIES:  White blood cell count  4.8, hemoglobin 11, hematocrit  34, MCV 69, platelet count 293.  PT 12.3, PTT 26.  Antithrombin III level  was 106 (normal 75-120).  Serum sodium 138, potassium 3.7, chloride 105,  carbon dioxide 27, BUN 5, creatinine 0.7, glucose 94.  A right lower  extremity DVT ultrasound showed proximal right calf vein thrombus and no  evidence of a Baker'Ann cyst.   HOSPITAL COURSE:  The patient was admitted to a medical bed without  telemetry.  She was initially started on heparin and transitioned to  Coumadin per pharmacist protocol.  She had no unusual bleeding and gradually  the discomfort in her right calf improved.  She had an upper GI study done  to assess her GERD and iron deficiency anemia.  This study showed a  patchoulis gastroesophageal junction with mild to moderate gastroesophageal  reflux.  There was marked mucosal edema and spasm of the entire duodenum  with an ulcer in the apex of the duodenal bulb.  A GI consultant was asked  to assess her.  He did not feel that an EGD was indicated as she showed no  active bleeding despite the use of heparin.  He recommended short-term  treatment with high-dose proton pump inhibitor, then  transitioning to once-  daily proton pump inhibitor treatment.  He agreed with the importance of  checking for Helicobacter pyloris serology.   Following discharge, results of special blood studies returned as follows:  A factor V Leiden mutation test was negative, a protein C level was normal  as well as protein Ann levels and anticardiolipin antibody studies.  The  Helicobacter pyloris antibody test returned at 8.24, with positive levels  being greater than 1.10.   CONDITION ON DISCHARGE:  She denied shortness of breath and had no unusual  bleeding.  Her right calf was no longer tender and her most recent INR level  was 2.6.   PROCEDURES:  Upper GI series.   COMPLICATIONS:  None.   DISCHARGE DIAGNOSES:  1. Right lower extremity deep venous thrombosis.  2. Duodenal ulcer.  3. Helicobacter pyloris infection.  4. Common migraine headaches.  5. Gastroesophageal reflux disease.  6. Scoliosis.   DISCHARGE MEDICATIONS:  1. Coumadin 5 mg p.o. once daily.  2. Topamax 25 mg p.o. q.h.Ann.  3. Prilosec over-the-counter 20 mg one tablet p.o. twice daily.    FOLLOW-UP:  She is advised to come to [facility] on November 19, 2002 for a recheck of her INR levels.  She will be called to arrange for  treatment of Helicobacter pyloris infection.                                               Barry Dienes Eloise Harman, M.D.    DGP/MEDQ  D:  12/12/2002  T:  12/12/2002  Job:  [id number]   cc:   Fayrene Fearing L. Malon Kindle., M.D.  [address]  [place]  [place] [address]  Fax: [phone]

## 2010-10-22 NOTE — Op Note (Signed)
[facility]  PatientLILLIEN Solomon                      MRN: [id number] Proc. Date: 05/12/99 Adm. Date:  [id number] Attending:  Deniece Ree                           Operative Report  PREOPERATIVE DIAGNOSIS:       Multiparity, desirous of permanent sterilization.  POSTOPERATIVE DIAGNOSIS:      Multiparity, desirous of permanent sterilization.  OPERATION:                    Bilateral tubal ligation using the modified Pomeroy procedure.  SURGEON:                      Deniece Ree, M.D.  ASSISTANT:  ANESTHESIA:                   Epidural.  ESTIMATED BLOOD LOSS:         Less than 25 cc.  COMPLICATIONS:                None.  CONDITION:                    The patient tolerated the procedure well and returned to the recovery room in satisfactory condition.  DESCRIPTION OF PROCEDURE:     The patient was taken to the operating room and prepped and draped in the usual sterile fashion for postpartum tubal ligation surgery.  A subumbilical incision was made.  This was carried down to the fascia at which time the fascia was entered and carried through the peritoneal cavity. At this point with some manipulation, the right tube was identified, grasped with  Babcock clamp, followed out until the fimbriated end could be identified.  It was then knuckled up and utilizing 0 plain catgut ligated in a routine modified Pomeroy procedure.  This was likewise on the left side.  Both tubal segments were then labled and sent to pathology.  Hemostasis was present.  Sponge, needle, and instrument counts were correct x 2.  The peritoneum and fascia was then closed using #1 chromic in a running locking stitch followed by a subcuticular stitch using 4-0 Vicryl.  The procedure was then terminated.  The patient tolerated the procedure well and returned to the recovery room in satisfactory condition. DD:  05/12/99 TD:  05/13/99 Job: [id number]

## 2010-10-22 NOTE — Consult Note (Signed)
[name], WAYMENT                        ACCOUNT NO.:  [id number]   MEDICAL RECORD NO.:  [id number]                   PATIENT TYPE:  INP   LOCATION:  0473                                 FACILITY:  [facility]   PHYSICIAN:  James L. Malon Kindle., M.D.          DATE OF BIRTH:  28-Feb-1971   DATE OF CONSULTATION:  11/14/2002  DATE OF DISCHARGE:                                   CONSULTATION   REASON FOR CONSULTATION:  Duodenal ulcer.   HISTORY OF PRESENT ILLNESS:  A very nice 40 year old black female who had a  one month history of pain in the right lower extremity presented to the  emergency room with this and was found to have deep vein thrombosis.  She  was started on therapy with heparin and Coumadin.  She has had intermittent  gastroesophageal reflux disease and had been taking p.r.n. Nexium, had not  been bothering her recently.  She stated that she took it about half the  time, but had not taken any recently.  She has also been on Prevacid and  famotidine in the past for reflux.  She takes Aleve for headaches, and has  been on Vioxx here in the hospital.  The issue is what to do with the  duodenal ulcer which was determined by upper GI series today.  This also  showed marked reflux.   CURRENT MEDICATIONS:  1. Heparin protocol.  2. Coumadin.  3. Vioxx.  4. Protonix.  5. Topamax.  6. Ambien.   ALLERGIES:  No known drug allergies.   PAST MEDICAL HISTORY:  1. Chronic gastroesophageal reflux disease, treated with p.r.n. proton pump     inhibitor and H2 blockers.  2. History of scoliosis.  3. Chronic migraines, treated with nonsteroidal drugs.   FAMILY HISTORY:  Father died of cancer of unclear primary.  Mother has  diabetes and hypertension.  There is no family history of cancer.   SOCIAL HISTORY:  She is married, has three children, works at [employer].  Is a restorative rehabilitation specialist.   PHYSICAL EXAMINATION:  VITAL SIGNS:  The patient has been  afebrile since  admission.  Blood pressure is 127/63.  GENERAL:  A somewhat chubby black female in no acute distress.  HEENT:  Sclerae nonicteric.  LUNGS:  Clear.  HEART:  Regular rate and rhythm without murmurs, rubs, or gallops.  ABDOMEN:  Soft, completely nontender.  RECTAL:  Not performed.  The patient has her leg propped up with a heating  pad on the right leg.  GASTROINTESTINAL:  Duodenal ulcer that is active with reflux.   ASSESSMENT:  1. Duodenal ulcer.  She certainly is at risk for bleeding with heparin and     Coumadin, but so far in spite of the use of heparin and Coumadin and     Vioxx, she had not bled, so I think at this point the risk is unlikely as  long as the ulcer is treated.  2. Chronic gastroesophageal reflux.  This has probably been the cause of a     good bit of her symptoms, will likely need chronic therapy.  3. Deep vein thrombosis, on anticoagulation.   PLAN:  1. We will give empiric proton pump inhibitors and recommend b.i.d. for a     week and then daily indefinitely.  2. We will check H. pylori serology and we will treat after the acute ulcer     is healed.  3. Would stop Vioxx and all nonsteroidal drugs.                                                 James L. Malon Kindle., M.D.    Waldron Session  D:  11/14/2002  T:  11/14/2002  Job:  [id number]   cc:   Barry Dienes. Eloise Harman, M.D.  [address]  [place]  [place] [address]  Fax: [phone]

## 2010-10-22 NOTE — H&P (Signed)
[name], [name]                        ACCOUNT NO.:  [id number]   MEDICAL RECORD NO.:  [id number]                   PATIENT TYPE:  INP   LOCATION:  0473                                 FACILITY:  [facility]   PHYSICIAN:  Barry Dienes. Eloise Harman, M.D.            DATE OF BIRTH:  [birth year]   DATE OF ADMISSION:  11/12/2002  DATE OF DISCHARGE:                                HISTORY & PHYSICAL   PERTINENT FINDINGS:  The patient is a 40 year old black female with a one  month history of swelling and pain in the right lower extremity.  She denies  shortness of breath, fever, and has no personal or family history of DVT or  pulmonary embolism.  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  Two weeks ago,  she had a three hour automobile drive.  She has had no injury to the right  leg.   PAST MEDICAL HISTORY:  1. Scoliosis.  2. Gastroesophageal reflux disease determined by clinical diagnosis.  3. Chronic common migraine headaches.   MEDICATIONS:  1. Tylenol p.r.n.  2. Nexium 40 mg p.o. daily p.r.n.  3. Aleve p.r.n. headache.   ALLERGIES:  No known drug allergies.   PAST SURGICAL HISTORY:  Bilateral tubal ligation.   FAMILY HISTORY:  Father died at age 65 of cancer of uncertain type.  Mother  is age 28 years and has diabetes mellitus, type 2 and hypertension.  She has  a brother, age 14, who is alive and well.  She has three daughters, ages 74  years, 8 years, and 3 years, who are alive and well.   SOCIAL HISTORY:  She is married and has three daughters.  She has no history  of tobacco or alcohol abuse.  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   REVIEW OF SYMPTOMS:  Significant for at least four days per week of  headaches of mild intensity that are relieved with Aleve.  She drinks  approximately two cups of coffee daily.  She has no symptoms of shortness of  breath or exertional  chest pain.  She has mild gastroesophageal reflux  disease symptoms after meals if she lies supine.  She has no constipation or  diarrhea.   PHYSICAL EXAMINATION:  VITAL SIGNS:  Blood pressure 131/83, pulse 85,  respirations 18, temperature 97.6, weight 214 pounds, height 63 inches.  GENERAL:  She is a mildly overweight, black female, in no apparent distress.  HEENT:  Pupils are equal, round, and reactive to light and accommodation.  Extraocular movements are intact.  NECK:  Supple and without jugular venous distension or carotid bruit.  CHEST:  Clear to auscultation.  HEART:  Regular rate and rhythm; S1 and S2 are present without murmur,  gallop, or rub.  ABDOMEN:  Normal bowel sounds with no hepatosplenomegaly or tenderness.  EXTREMITIES:  Without  cyanosis, clubbing, or edema.  The right lower  extremity has tender proximal calf veins and veins in the popliteal fossa.  NEUROLOGIC:  She is alert and oriented x 3.  Cranial nerves 2-12 are normal.  She moves four extremities without difficulty.  Cerebellar function and gait  are not assessed at this time.   LABORATORY STUDIES:  White blood cell count 4.8, hemoglobin 11, hematocrit  34, platelet count 293, MCV 69, PT 12.3, PTT 26, antithrombin three-level  was 106 (normal 75-120), serum sodium 138, potassium 3.7, chloride 105. CO2  27, BUN 5, creatinine 0.7, glucose 94.  A right lower extremity DVT  ultrasound test today shows right calf vein thrombosis with no Baker's cyst.   IMPRESSION AND PLAN:  1. Right lower extremity deep venous thrombosis:  This is stable on heparin,     and we will plan to start Coumadin with an anticipated six month course     of treatment.  A hypercoagulable work-up has been initiated.  2. Headaches:  Given her nonfocal neurological exam, this is likely common     migraines.  I plan to treat with high-dose Celebrex for board of     treatment for now and initiate Topamax prophylaxis.  3. Gastroesophageal  reflux disease:  Stable on proton pump inhibitor     treatment.  We will obtain a barium swallow test to confirm her clinical     diagnosis of gastroesophageal reflux disease and rule out the unlikely     possibility of esophageal or gastric neoplasm.  In the meantime, Protonix     40 mg daily will be started.                                               Barry Dienes Eloise Harman, M.D.    DGP/MEDQ  D:  11/12/2002  T:  11/13/2002  Job:  [id number]

## 2010-11-08 ENCOUNTER — Telehealth: Payer: Self-pay | Admitting: Gastroenterology

## 2010-11-08 NOTE — Telephone Encounter (Signed)
Left message for pt to call back.  Pt states she is having her gallbladder removed by Dr. Daphine Deutscher 11/19/10. She wanted to make sure we were aware.

## 2010-11-15 ENCOUNTER — Telehealth: Payer: Self-pay | Admitting: *Deleted

## 2010-11-15 ENCOUNTER — Encounter (HOSPITAL_COMMUNITY): Payer: BC Managed Care – PPO | Attending: Surgery

## 2010-11-15 ENCOUNTER — Other Ambulatory Visit (INDEPENDENT_AMBULATORY_CARE_PROVIDER_SITE_OTHER): Payer: Self-pay | Admitting: Surgery

## 2010-11-15 DIAGNOSIS — Z01812 Encounter for preprocedural laboratory examination: Secondary | ICD-10-CM | POA: Insufficient documentation

## 2010-11-15 LAB — HCG, SERUM, QUALITATIVE: Preg, Serum: NEGATIVE

## 2010-11-15 LAB — CBC
HCT: 21.9 % — ABNORMAL LOW (ref 36.0–46.0)
MCV: 52.9 fL — ABNORMAL LOW (ref 78.0–100.0)
Platelets: 374 10*3/uL (ref 150–400)
RBC: 4.14 MIL/uL (ref 3.87–5.11)
RDW: 19.6 % — ABNORMAL HIGH (ref 11.5–15.5)
WBC: 5.5 10*3/uL (ref 4.0–10.5)

## 2010-11-15 LAB — SURGICAL PCR SCREEN
MRSA, PCR: NEGATIVE
Staphylococcus aureus: NEGATIVE

## 2010-11-15 NOTE — Telephone Encounter (Signed)
Received a call from [place] at [facility]. She is calling to clarify if patient has had an EGD or colonoscopy recently. They have received labs back on her pre op and her HGB is 6. Reviewed with her patient's last endoscopies were in 2010. Only had an ultrasound at recent OV.

## 2010-11-16 ENCOUNTER — Encounter (INDEPENDENT_AMBULATORY_CARE_PROVIDER_SITE_OTHER): Payer: Self-pay | Admitting: Surgery

## 2010-11-17 ENCOUNTER — Telehealth: Payer: Self-pay

## 2010-11-17 ENCOUNTER — Other Ambulatory Visit: Payer: Self-pay | Admitting: Gastroenterology

## 2010-11-17 DIAGNOSIS — D649 Anemia, unspecified: Secondary | ICD-10-CM

## 2010-11-17 NOTE — Telephone Encounter (Signed)
Left message for pt to call back  °

## 2010-11-17 NOTE — Telephone Encounter (Signed)
Received call from Dr. Ermalene Searing office. Pts gallbladder surgery has been cancelled due to low Hgb. Pt has had a low Hgb for quite a while. Dr. Daphine Deutscher would like this worked up by Dr. Arlyce Dice to find the source of her anemia before he does surgery, wants clearance from Dr. Arlyce Dice. Pt is not having pain from her gallbladder at this time. Pt scheduled to see Dr. Arlyce Dice 12/07/10 @2 :30pm. Dr. Arlyce Dice please advise if this appt date and time is ok.

## 2010-11-17 NOTE — Telephone Encounter (Signed)
Message copied by Michele Mcalpine on Wed Nov 17, 2010  3:39 PM ------      Message from: Melvia Heaps MD D      Created: Wed Nov 17, 2010  2:55 PM       Pt has a severe anemia.  Please get stool hemeoccults, begin feosol 1 tab bid and arrange for OV in  3 weeks.  Needs CBC, CMET in 3 weeks

## 2010-11-17 NOTE — Telephone Encounter (Signed)
Ok

## 2010-11-18 MED ORDER — FERROUS SULFATE 325 (65 FE) MG PO TBEC: 325.0000 mg | DELAYED_RELEASE_TABLET | Freq: Three times a day (TID) | ORAL | Status: DC

## 2010-11-18 MED ORDER — FERROUS SULFATE 325 (65 FE) MG PO TBEC: DELAYED_RELEASE_TABLET | ORAL | Status: DC

## 2010-11-18 NOTE — Telephone Encounter (Signed)
Addended by: Chrystie Nose R on: 11/18/2010 10:44 AM   Modules accepted: Orders

## 2010-11-18 NOTE — Telephone Encounter (Signed)
She can take iron sulfate 325mg  tid

## 2010-11-18 NOTE — Telephone Encounter (Signed)
Pt aware of instructions and appts. Rx sent to pharmacy.

## 2010-11-18 NOTE — Telephone Encounter (Signed)
Pt scheduled for OV and labs for 12/07/10@2 :30pm. Mailed stool cards to pt. Pt states that she has taken [facility] in the past and wanted to know if there is a stronger po Iron that she can take. Dr. Arlyce Dice please advise.

## 2010-11-19 ENCOUNTER — Ambulatory Visit (HOSPITAL_COMMUNITY): Admission: RE | Admit: 2010-11-19 | Payer: BC Managed Care – PPO | Source: Ambulatory Visit | Admitting: Surgery

## 2010-12-06 ENCOUNTER — Telehealth: Payer: Self-pay | Admitting: Gastroenterology

## 2010-12-06 NOTE — Telephone Encounter (Signed)
Spoke with pt and moved her appt to 3:45pm tomorrow. Pt aware.

## 2010-12-07 ENCOUNTER — Other Ambulatory Visit: Payer: BC Managed Care – PPO

## 2010-12-07 ENCOUNTER — Ambulatory Visit (INDEPENDENT_AMBULATORY_CARE_PROVIDER_SITE_OTHER): Payer: BC Managed Care – PPO | Admitting: Gastroenterology

## 2010-12-07 ENCOUNTER — Encounter: Payer: Self-pay | Admitting: Gastroenterology

## 2010-12-07 ENCOUNTER — Ambulatory Visit: Payer: BC Managed Care – PPO | Admitting: Gastroenterology

## 2010-12-07 ENCOUNTER — Other Ambulatory Visit (INDEPENDENT_AMBULATORY_CARE_PROVIDER_SITE_OTHER): Payer: BC Managed Care – PPO

## 2010-12-07 DIAGNOSIS — D509 Iron deficiency anemia, unspecified: Secondary | ICD-10-CM

## 2010-12-07 DIAGNOSIS — R1012 Left upper quadrant pain: Secondary | ICD-10-CM

## 2010-12-07 DIAGNOSIS — D649 Anemia, unspecified: Secondary | ICD-10-CM

## 2010-12-07 LAB — CBC WITH DIFFERENTIAL/PLATELET
Basophils Absolute: 0 10*3/uL (ref 0.0–0.1)
Eosinophils Absolute: 0.1 10*3/uL (ref 0.0–0.7)
Hemoglobin: 8.1 g/dL — ABNORMAL LOW (ref 12.0–15.0)
Lymphocytes Relative: 25.7 % (ref 12.0–46.0)
MCHC: 31.2 g/dL (ref 30.0–36.0)
Neutro Abs: 4.9 10*3/uL (ref 1.4–7.7)
Platelets: 496 10*3/uL — ABNORMAL HIGH (ref 150.0–400.0)
RDW: 36.7 % — ABNORMAL HIGH (ref 11.5–14.6)

## 2010-12-07 LAB — COMPREHENSIVE METABOLIC PANEL
ALT: 15 U/L (ref 0–35)
AST: 18 U/L (ref 0–37)
Calcium: 8.8 mg/dL (ref 8.4–10.5)
Chloride: 105 mEq/L (ref 96–112)
Creatinine, Ser: 0.6 mg/dL (ref 0.4–1.2)
Sodium: 138 mEq/L (ref 135–145)
Total Bilirubin: 0.5 mg/dL (ref 0.3–1.2)
Total Protein: 7.8 g/dL (ref 6.0–8.3)

## 2010-12-07 LAB — FERRITIN: Ferritin: 10.6 ng/mL (ref 10.0–291.0)

## 2010-12-07 LAB — IRON: Iron: 507 ug/dL — ABNORMAL HIGH (ref 42–145)

## 2010-12-07 NOTE — Progress Notes (Signed)
History of Present Illness:  Ms. [name] has returned for followup of her abdominal pain and for anemia. Abdominal ultrasound demonstrated gallbladder calculi and a thickened gallbladder wall. She continues to complain of right upper quadrant pain. At her surgical referral a severe microcytic anemia was noted. In June, 2012 hemoglobin was 6 and MCV was 52.9. Platelets were normal. One year ago her blood counts were similar. There is no history of rectal bleeding, melena or nsaid use. She claims her mother was anemic. She has heavy menstrual periods.    Review of Systems: Pertinent positive and negative review of systems were noted in the above HPI section. All other review of systems were otherwise negative.    Current Medications, Allergies, Past Medical History, Past Surgical History, Family History and Social History were reviewed in [facility] electronic medical record  Vital signs were reviewed in today's medical record. Physical Exam: General: Well developed , well nourished, no acute distress Head: Normocephalic and atraumatic Eyes:  sclerae anicteric, EOMI Ears: Normal auditory acuity Mouth: No deformity or lesions Lungs: Clear throughout to auscultation Heart: Regular rate and rhythm; no murmurs, rubs or bruits Abdomen: Soft, non tender and non distended. No masses, hepatosplenomegaly or hernias noted. Normal Bowel sounds Rectal: No rectal masses. Stool is Hemoccult-negative Musculoskeletal: Symmetrical with no gross deformities  Pulses:  Normal pulses noted Extremities: No clubbing, cyanosis, edema or deformities noted Neurological: Alert oriented x 4, grossly nonfocal Psychological:  Alert and cooperative. Normal mood and affect

## 2010-12-07 NOTE — Assessment & Plan Note (Signed)
This is probably an iron deficiency anemia, perhaps from menorrhagia. Chronic blood loss should be ruled out as well as thalassemia minor.  Recommendations #1 serial Hemoccults #2 check iron and ferritin levels #3 hemoglobin electrophoresis

## 2010-12-07 NOTE — Patient Instructions (Signed)
We have added labs on for you today  Iron,Ferriton,Hgb Electrophoresis You was given Hemoccult cards and will need to mail those back within 2 weeks

## 2010-12-07 NOTE — Assessment & Plan Note (Signed)
She has a chronic cholecystitis with cholelithiasis. Patient will undergo cholecystectomy once her anemia workup is completed.

## 2010-12-09 ENCOUNTER — Telehealth: Payer: Self-pay | Admitting: *Deleted

## 2010-12-09 DIAGNOSIS — D649 Anemia, unspecified: Secondary | ICD-10-CM

## 2010-12-09 NOTE — Telephone Encounter (Signed)
Gave pt CBC results and put orders for her to come in have a follow up CBC drawn. Scheduled for 12/29/2010

## 2010-12-09 NOTE — Telephone Encounter (Signed)
Message copied by Marlowe Kays on Thu Dec 09, 2010  1:48 PM ------      Message from: Melvia Heaps MD D      Created: Tue Dec 07, 2010  4:39 PM       Needs repeat cbc 3 weeks

## 2010-12-10 LAB — HEMOGLOBINOPATHY EVALUATION
Hgb F Quant: 0 % (ref 0.0–2.0)
Hgb S Quant: 0 % (ref 0.0–0.0)

## 2011-02-25 LAB — COMPREHENSIVE METABOLIC PANEL
AST: 24
Albumin: 3.2 — ABNORMAL LOW
Alkaline Phosphatase: 48
BUN: 3 — ABNORMAL LOW
Chloride: 98
Creatinine, Ser: 0.67
GFR calc Af Amer: >60
Potassium: 3.6
Total Protein: 7

## 2011-02-25 LAB — CBC
HCT: 29.3 — ABNORMAL LOW
Platelets: 287
RDW: 20.2 — ABNORMAL HIGH
WBC: 8.3

## 2011-02-25 LAB — DIFFERENTIAL
Eosinophils Relative: 0
Lymphocytes Relative: 20
Monocytes Absolute: 0.8
Monocytes Relative: 10
Neutro Abs: 5.8

## 2011-02-25 LAB — URINALYSIS, ROUTINE W REFLEX MICROSCOPIC
Glucose, UA: NEGATIVE
Ketones, ur: NEGATIVE
Nitrite: NEGATIVE
Specific Gravity, Urine: 1.02
pH: 6

## 2011-02-25 LAB — URINE MICROSCOPIC-ADD ON

## 2011-02-25 LAB — URINE CULTURE
Colony Count: NO GROWTH
Culture: NO GROWTH

## 2011-02-25 LAB — PREGNANCY, URINE: Preg Test, Ur: NEGATIVE

## 2011-03-01 ENCOUNTER — Other Ambulatory Visit: Payer: Self-pay | Admitting: Gastroenterology

## 2011-03-11 ENCOUNTER — Other Ambulatory Visit (INDEPENDENT_AMBULATORY_CARE_PROVIDER_SITE_OTHER): Payer: BC Managed Care – PPO

## 2011-03-11 ENCOUNTER — Encounter: Payer: Self-pay | Admitting: Gastroenterology

## 2011-03-11 ENCOUNTER — Ambulatory Visit (INDEPENDENT_AMBULATORY_CARE_PROVIDER_SITE_OTHER): Payer: BC Managed Care – PPO | Admitting: Gastroenterology

## 2011-03-11 VITALS — BP 132/82 | HR 80 | Ht 63.0 in | Wt 184.0 lb

## 2011-03-11 DIAGNOSIS — D649 Anemia, unspecified: Secondary | ICD-10-CM

## 2011-03-11 LAB — POCT I-STAT, CHEM 8
BUN: 12 mg/dL (ref 6–23)
Hemoglobin: 10.9 g/dL — ABNORMAL LOW (ref 12.0–15.0)
Sodium: 139 mEq/L (ref 135–145)
TCO2: 24 mmol/L (ref 0–100)

## 2011-03-11 LAB — POCT PREGNANCY, URINE: Preg Test, Ur: NEGATIVE

## 2011-03-11 LAB — CBC WITH DIFFERENTIAL/PLATELET
Basophils Relative: 0.5 % (ref 0.0–3.0)
Eosinophils Relative: 0.6 % (ref 0.0–5.0)
Lymphocytes Relative: 45.4 % (ref 12.0–46.0)
Neutrophils Relative %: 42.8 % — ABNORMAL LOW (ref 43.0–77.0)
RBC: 4.32 Mil/uL (ref 3.87–5.11)
WBC: 4.8 10*3/uL (ref 4.5–10.5)

## 2011-03-11 LAB — URINALYSIS, ROUTINE W REFLEX MICROSCOPIC
Ketones, ur: NEGATIVE mg/dL
Nitrite: NEGATIVE
Protein, ur: NEGATIVE mg/dL
Urobilinogen, UA: 0.2 mg/dL (ref 0.0–1.0)

## 2011-03-11 LAB — DIFFERENTIAL
Basophils Absolute: 0.1 10*3/uL (ref 0.0–0.1)
Lymphs Abs: 2.8 10*3/uL (ref 0.7–4.0)
Monocytes Absolute: 0.4 10*3/uL (ref 0.1–1.0)
Monocytes Relative: 6 % (ref 3–12)
Neutrophils Relative %: 53 % (ref 43–77)

## 2011-03-11 LAB — CBC
HCT: 30 % — ABNORMAL LOW (ref 36.0–46.0)
Hemoglobin: 9.6 g/dL — ABNORMAL LOW (ref 12.0–15.0)
MCHC: 32.1 g/dL (ref 30.0–36.0)
MCV: 64.5 fL — ABNORMAL LOW (ref 78.0–100.0)
RBC: 4.64 MIL/uL (ref 3.87–5.11)

## 2011-03-11 LAB — COMPREHENSIVE METABOLIC PANEL
ALT: 26 U/L (ref 0–35)
BUN: 11 mg/dL (ref 6–23)
CO2: 24 mEq/L (ref 19–32)
Calcium: 9.1 mg/dL (ref 8.4–10.5)
GFR calc non Af Amer: >60 mL/min (ref >60)
Glucose, Bld: 106 mg/dL — ABNORMAL HIGH (ref 70–99)
Sodium: 132 mEq/L — ABNORMAL LOW (ref 135–145)

## 2011-03-11 NOTE — Assessment & Plan Note (Signed)
Pain is due to chronic cholecystitis. She's going to schedule a cholecystectomy.

## 2011-03-11 NOTE — Progress Notes (Signed)
Ms. [name] has returned for followup of her anemia and abdominal pain. Upper endoscopy in December, 2010 was remarkable for esophagitis. Colonoscopy was negative. Blood work from July was pertinent for a normal hemoglobin electrophoresis. Serum iron was 507 and serum ferritin level was 10. Hemoccults were requested but not done. She continues to have menorrhagia. With ron supplementation  her hemoglobin increased by 2 g. Her main complaint is intermittent right upper quadrant pain. She denies melena or hematochezia.

## 2011-03-11 NOTE — Patient Instructions (Signed)
Go to the basement today for labs We are giving you Prilosec samples

## 2011-03-11 NOTE — Assessment & Plan Note (Signed)
This is most likely due to menorrhagia.  Recommendations #1 continue iron supplementation #2 repeat CBC

## 2011-09-08 ENCOUNTER — Ambulatory Visit (INDEPENDENT_AMBULATORY_CARE_PROVIDER_SITE_OTHER): Payer: BC Managed Care – PPO | Admitting: Surgery

## 2011-09-08 ENCOUNTER — Encounter (INDEPENDENT_AMBULATORY_CARE_PROVIDER_SITE_OTHER): Payer: Self-pay | Admitting: Surgery

## 2011-09-08 VITALS — BP 140/84 | HR 84 | Temp 96.8°F | Resp 16 | Ht 61.75 in | Wt 194.0 lb

## 2011-09-08 DIAGNOSIS — K8020 Calculus of gallbladder without cholecystitis without obstruction: Secondary | ICD-10-CM

## 2011-09-08 DIAGNOSIS — K811 Chronic cholecystitis: Secondary | ICD-10-CM

## 2011-09-08 NOTE — Progress Notes (Signed)
Chief Complaint:  Recurrent upper abdominal pain and gallstones  History of Present Illness:  [name] is an 41 y.o. female That I saw last year with her husband to discuss cholecystectomy. She has multiple stones and has been having more right upper quadrant and bandlike, pain after eating. She comes today wanting to go in and schedule laparoscopic cholecystectomy. In addition at some point she like to go on phenterimine or other weight loss drugs and we will discuss later.  I explained the procedure again to her emphasizing the risks not limited to bleeding, bile leaks, or common bile duct injury. She was to proceed and we'll schedule it was at [facility].   Past Medical History  Diagnosis Date  . Heartburn   . Epigastric pain   . Gallstones   . Iron deficiency anemia     No past surgical history on file.  Current Outpatient Prescriptions  Medication Sig Dispense Refill  . Calcium Carbonate Antacid (TUMS PO) Take by mouth. As needed      . ferrous sulfate 325 (65 FE) MG EC tablet Take 1 tablet (325 mg total) by mouth 3 (three) times daily with meals.  90 tablet  3  . hyoscyamine (HYOMAX-SR) 0.375 MG 12 hr tablet        . Ibuprofen 200 MG CAPS Take by mouth. Will take 2-4 daily as needed for pain       . omeprazole (PRILOSEC) 40 MG capsule TAKE ONE CAPSULE EACH DAY 30 MINUTES BEFORE BREAKFAST.  30 capsule  5   Review of patient's allergies indicates no known allergies. Family History  Problem Relation Age of Onset  . Diabetes      Family History  . Colon cancer Neg Hx    Social History:   reports that she has never smoked. She has never used smokeless tobacco. She reports that she drinks alcohol. She reports that she does not use illicit drugs.  Works at [employer]   REVIEW OF SYSTEMS - PERTINENT POSITIVES ONLY: noncontributory  Physical Exam:   Blood pressure 140/84, pulse 84, temperature 96.8 F (36 C), temperature source Temporal, resp. rate 16,  height 5' 1.75" (1.568 m), weight 194 lb (87.998 kg). Body mass index is 35.77 kg/(m^2).  Gen:  WDWN AA female NAD  Neurological: Alert and oriented to person, place, and time. Motor and sensory function is grossly intact  Head: Normocephalic and atraumatic.  Eyes: Conjunctivae are normal. Pupils are equal, round, and reactive to light. No scleral icterus.  Neck: Normal range of motion. Neck supple. No tracheal deviation or thyromegaly present.  Cardiovascular:  SR without murmurs or gallops.  No carotid bruits Respiratory: Effort normal.  No respiratory distress. No chest wall tenderness. Breath sounds normal.  No wheezes, rales or rhonchi.  Abdomen:  nontender GU: Musculoskeletal: Normal range of motion. Extremities are nontender. No cyanosis, edema or clubbing noted Lymphadenopathy: No cervical, preauricular, postauricular or axillary adenopathy is present Skin: Skin is warm and dry. No rash noted. No diaphoresis. No erythema. No pallor. Pscyh: Normal mood and affect. Behavior is normal. Judgment and thought content normal.   LABORATORY RESULTS: No results found for this or any previous visit (from the past 48 hour(s)).  RADIOLOGY RESULTS: No results found.  Problem List: Patient Active Problem List  Diagnoses  . IRON DEFICIENCY  . ESOPHAGEAL REFLUX  . ABDOMINAL PAIN, LEFT UPPER QUADRANT    Assessment & Plan: Symptomatic gallstones.  Plan lap chole with IOC  Matt B. Daphine Deutscher, MD, [facility] [phone] beeper [phone]  09/08/2011 10:18 AM

## 2011-09-23 ENCOUNTER — Encounter (HOSPITAL_COMMUNITY): Payer: Self-pay | Admitting: Pharmacy Technician

## 2011-09-23 ENCOUNTER — Encounter (HOSPITAL_COMMUNITY): Payer: Self-pay

## 2011-09-23 ENCOUNTER — Encounter (HOSPITAL_COMMUNITY)
Admission: RE | Admit: 2011-09-23 | Discharge: 2011-09-23 | Disposition: A | Discharge status: Home / Self Care | Payer: BC Managed Care – PPO | Source: Ambulatory Visit | Attending: Surgery | Admitting: Surgery

## 2011-09-23 ENCOUNTER — Telehealth (INDEPENDENT_AMBULATORY_CARE_PROVIDER_SITE_OTHER): Payer: Self-pay | Admitting: General Surgery

## 2011-09-23 HISTORY — DX: Headache: R51

## 2011-09-23 LAB — CBC
HCT: 27.1 % — ABNORMAL LOW (ref 36.0–46.0)
Hemoglobin: 8 g/dL — ABNORMAL LOW (ref 12.0–15.0)
WBC: 6.2 10*3/uL (ref 4.0–10.5)

## 2011-09-23 LAB — SURGICAL PCR SCREEN: Staphylococcus aureus: POSITIVE — AB

## 2011-09-23 MED ORDER — CHLORHEXIDINE GLUCONATE 4 % EX LIQD: 1.0000 "application " | Freq: Once | CUTANEOUS | Status: DC

## 2011-09-23 NOTE — Patient Instructions (Addendum)
20 [name]  09/23/2011   Your procedure is scheduled on:  10/05/11  Report to SHORT STAY DEPT  at 8:30 AM.  Call this number if you have problems the morning of surgery: [phone]   Remember:   Do not eat food or drink liquids AFTER MIDNIGHT   Take these medicines the morning of surgery with A SIP OF WATER: OMEPRAZOLE   Do not wear jewelry, make-up or nail polish.  Do not wear lotions, powders, or perfumes.   Do not shave legs or underarms 48 hrs. before surgery (men may shave face)  Do not bring valuables to the hospital.  Contacts, dentures or bridgework may not be worn into surgery.  Leave suitcase in the car. After surgery it may be brought to your room.  For patients admitted to the hospital, checkout time is 11:00 AM the day of discharge.   Patients discharged the day of surgery will not be allowed to drive home.    Special Instructions:   Please read over the following fact sheets that you were given: MRSA  Information               SHOWER WITH BETASEPT THE NIGHT BEFORE SURGERY AND THE MORNING OF SURGERY                 USE FLEET ENEMA THE NIGHT BEFORE SURGERY                STOP ALL ASPIRIN , IBUPROFEN, MOTRIN, ALEEVE AND HERBAL MEDICATIONS 7 DAYS PREOP

## 2011-09-23 NOTE — Pre-Procedure Instructions (Signed)
SPOKE WITH BARBARA AT OFFICE CONCERNING ABNORMAL CBC AND NASAL SCREEN - SHE WILL INFORM DR MARTIN'S NURSE

## 2011-09-23 NOTE — Telephone Encounter (Signed)
[name], at [facility] pre surgical, calling to alert Dr. Daphine Deutscher of abnormal labs drawn 09/23/11.

## 2011-10-04 NOTE — H&P (Addendum)
Chief Complaint: Recurrent upper abdominal pain and gallstones  History of Present Illness: [name] is an 41 y.o. female That I saw last year with her husband to discuss cholecystectomy. She has multiple stones and has been having more right upper quadrant and bandlike, pain after eating. She comes today wanting to go in and schedule laparoscopic cholecystectomy. In addition at some point she like to go on phenterimine or other weight loss drugs and we will discuss later.  I explained the procedure again to her emphasizing the risks not limited to bleeding, bile leaks, or common bile duct injury. She was to proceed and we'll schedule it was at [facility].  Past Medical History   Diagnosis  Date   .  Heartburn    .  Epigastric pain    .  Gallstones    .  Iron deficiency anemia     No past surgical history on file.  Current Outpatient Prescriptions   Medication  Sig  Dispense  Refill   .  Calcium Carbonate Antacid (TUMS PO)  Take by mouth. As needed     .  ferrous sulfate 325 (65 FE) MG EC tablet  Take 1 tablet (325 mg total) by mouth 3 (three) times daily with meals.  90 tablet  3   .  hyoscyamine (HYOMAX-SR) 0.375 MG 12 hr tablet      .  Ibuprofen 200 MG CAPS  Take by mouth. Will take 2-4 daily as needed for pain     .  omeprazole (PRILOSEC) 40 MG capsule  TAKE ONE CAPSULE EACH DAY 30 MINUTES BEFORE BREAKFAST.  30 capsule  5    Review of patient's allergies indicates no known allergies.  Family History   Problem  Relation  Age of Onset   .  Diabetes        Family History    .  Colon cancer  Neg Hx     Social History: reports that she has never smoked. She has never used smokeless tobacco. She reports that she drinks alcohol. She reports that she does not use illicit drugs. Works at [employer]  REVIEW OF SYSTEMS - PERTINENT POSITIVES ONLY:  noncontributory  Physical Exam:  Blood pressure 140/84, pulse 84, temperature 96.8 F (36 C), temperature source  Temporal, resp. rate 16, height 5' 1.75" (1.568 m), weight 194 lb (87.998 kg).  Body mass index is 35.77 kg/(m^2).  Gen: WDWN AA female NAD  Neurological: Alert and oriented to person, place, and time. Motor and sensory function is grossly intact  Head: Normocephalic and atraumatic.  Eyes: Conjunctivae are normal. Pupils are equal, round, and reactive to light. No scleral icterus.  Neck: Normal range of motion. Neck supple. No tracheal deviation or thyromegaly present.  Cardiovascular: SR without murmurs or gallops. No carotid bruits  Respiratory: Effort normal. No respiratory distress. No chest wall tenderness. Breath sounds normal. No wheezes, rales or rhonchi.  Abdomen: nontender  GU:  Musculoskeletal: Normal range of motion. Extremities are nontender. No cyanosis, edema or clubbing noted Lymphadenopathy: No cervical, preauricular, postauricular or axillary adenopathy is present Skin: Skin is warm and dry. No rash noted. No diaphoresis. No erythema. No pallor. Pscyh: Normal mood and affect. Behavior is normal. Judgment and thought content normal.  LABORATORY RESULTS:  No results found for this or any previous visit (from the past 48 hour(s)).  RADIOLOGY RESULTS:  No results found.  Problem List:  Patient Active Problem List   Diagnoses   .  IRON DEFICIENCY   .  ESOPHAGEAL REFLUX   .  ABDOMINAL PAIN, LEFT UPPER QUADRANT    Assessment & Plan:  Symptomatic gallstones. Plan lap chole with IOC  Matt B. Daphine Deutscher, MD, [facility]  [phone] beeper  [phone]  There has been no change in the patient's past medical history or physical exam in the past 24 hours to the best of my knowledge. I examined the patient in the holding area and have made any changes to the history and physical exam report that is included above.   Expectations and outcome results have been discussed with the patient to include risks and benefits.  All questions have been answered and we  will proceed with previously discussed procedure noted and signed in the consent form in the patient's record.    Rayland Hamed BMD FACS 10:23 AM  10/05/2011

## 2011-10-05 ENCOUNTER — Ambulatory Visit (HOSPITAL_COMMUNITY)
Admission: RE | Admit: 2011-10-05 | Discharge: 2011-10-06 | Disposition: A | Discharge status: Home / Self Care | Payer: BC Managed Care – PPO | Source: Ambulatory Visit | Attending: Surgery | Admitting: Surgery

## 2011-10-05 ENCOUNTER — Ambulatory Visit (HOSPITAL_COMMUNITY): Payer: BC Managed Care – PPO

## 2011-10-05 ENCOUNTER — Encounter (HOSPITAL_COMMUNITY): Payer: Self-pay | Admitting: Anesthesiology

## 2011-10-05 ENCOUNTER — Ambulatory Visit (HOSPITAL_COMMUNITY): Payer: BC Managed Care – PPO | Admitting: Anesthesiology

## 2011-10-05 ENCOUNTER — Encounter (HOSPITAL_COMMUNITY)
Admission: RE | Disposition: A | Discharge status: Home / Self Care | Payer: Self-pay | Source: Ambulatory Visit | Attending: Surgery

## 2011-10-05 ENCOUNTER — Encounter (HOSPITAL_COMMUNITY): Payer: Self-pay | Admitting: *Deleted

## 2011-10-05 DIAGNOSIS — K219 Gastro-esophageal reflux disease without esophagitis: Secondary | ICD-10-CM | POA: Insufficient documentation

## 2011-10-05 DIAGNOSIS — D509 Iron deficiency anemia, unspecified: Secondary | ICD-10-CM | POA: Insufficient documentation

## 2011-10-05 DIAGNOSIS — K8020 Calculus of gallbladder without cholecystitis without obstruction: Secondary | ICD-10-CM | POA: Insufficient documentation

## 2011-10-05 DIAGNOSIS — K8010 Calculus of gallbladder with chronic cholecystitis without obstruction: Secondary | ICD-10-CM

## 2011-10-05 DIAGNOSIS — Z79899 Other long term (current) drug therapy: Secondary | ICD-10-CM | POA: Insufficient documentation

## 2011-10-05 HISTORY — DX: Acute embolism and thrombosis of unspecified vein: I82.90

## 2011-10-05 HISTORY — PX: CHOLECYSTECTOMY: SHX55

## 2011-10-05 LAB — CBC
HCT: 26.5 % — ABNORMAL LOW (ref 36.0–46.0)
Hemoglobin: 7.7 g/dL — ABNORMAL LOW (ref 12.0–15.0)
MCHC: 29.1 g/dL — ABNORMAL LOW (ref 30.0–36.0)
RBC: 4.41 MIL/uL (ref 3.87–5.11)
WBC: 7.7 10*3/uL (ref 4.0–10.5)

## 2011-10-05 LAB — CREATININE, SERUM
Creatinine, Ser: 0.68 mg/dL (ref 0.50–1.10)
GFR calc Af Amer: >90 mL/min (ref >90)
GFR calc non Af Amer: >90 mL/min (ref >90)

## 2011-10-05 SURGERY — LAPAROSCOPIC CHOLECYSTECTOMY WITH INTRAOPERATIVE CHOLANGIOGRAM
Anesthesia: General | Site: Abdomen | Wound class: Clean Contaminated

## 2011-10-05 MED ORDER — LACTATED RINGERS IR SOLN
Status: DC | PRN
  Administered 2011-10-05: 1000 mL

## 2011-10-05 MED ORDER — BUPIVACAINE-EPINEPHRINE 0.25% -1:200000 IJ SOLN
INTRAMUSCULAR | Status: DC | PRN
  Administered 2011-10-05: 20 mL

## 2011-10-05 MED ORDER — LIP MEDEX EX OINT
TOPICAL_OINTMENT | CUTANEOUS | Status: AC
  Administered 2011-10-05: 15:00:00
  Filled 2011-10-05: qty 7

## 2011-10-05 MED ORDER — MORPHINE SULFATE 2 MG/ML IJ SOLN
1.0000 mg | INTRAMUSCULAR | Status: DC | PRN
  Administered 2011-10-05 – 2011-10-06 (×3): 1 mg via INTRAVENOUS
  Filled 2011-10-05 (×3): qty 1

## 2011-10-05 MED ORDER — HYOSCYAMINE SULFATE ER 0.375 MG PO TB12
0.3750 mg | ORAL_TABLET | Freq: Two times a day (BID) | ORAL | Status: DC | PRN
  Filled 2011-10-05: qty 1

## 2011-10-05 MED ORDER — PROPOFOL 10 MG/ML IV BOLUS
INTRAVENOUS | Status: DC | PRN
  Administered 2011-10-05: 200 mg via INTRAVENOUS

## 2011-10-05 MED ORDER — DEXTROSE 5 % IV SOLN
2.0000 g | Freq: Once | INTRAVENOUS | Status: AC
  Administered 2011-10-05: 2 g via INTRAVENOUS
  Filled 2011-10-05: qty 2

## 2011-10-05 MED ORDER — SODIUM CHLORIDE 0.9 % IR SOLN
Status: DC | PRN
  Administered 2011-10-05: 1000 mL

## 2011-10-05 MED ORDER — MIDAZOLAM HCL 5 MG/5ML IJ SOLN
INTRAMUSCULAR | Status: DC | PRN
  Administered 2011-10-05: 2 mg via INTRAVENOUS

## 2011-10-05 MED ORDER — NEOSTIGMINE METHYLSULFATE 1 MG/ML IJ SOLN
INTRAMUSCULAR | Status: DC | PRN
  Administered 2011-10-05: 4 mg via INTRAVENOUS

## 2011-10-05 MED ORDER — OXYCODONE-ACETAMINOPHEN 5-325 MG PO TABS
1.0000 | ORAL_TABLET | ORAL | Status: DC | PRN
  Administered 2011-10-06 (×2): 2 via ORAL
  Filled 2011-10-05 (×2): qty 2

## 2011-10-05 MED ORDER — HEPARIN SODIUM (PORCINE) 5000 UNIT/ML IJ SOLN
5000.0000 [IU] | Freq: Three times a day (TID) | INTRAMUSCULAR | Status: DC
  Administered 2011-10-05 – 2011-10-06 (×3): 5000 [IU] via SUBCUTANEOUS
  Filled 2011-10-05 (×5): qty 1

## 2011-10-05 MED ORDER — FENTANYL CITRATE 0.05 MG/ML IJ SOLN
25.0000 ug | INTRAMUSCULAR | Status: DC | PRN
  Administered 2011-10-05 (×2): 50 ug via INTRAVENOUS

## 2011-10-05 MED ORDER — HEPARIN SODIUM (PORCINE) 5000 UNIT/ML IJ SOLN
5000.0000 [IU] | Freq: Once | INTRAMUSCULAR | Status: AC
  Administered 2011-10-05: 5000 [IU] via SUBCUTANEOUS

## 2011-10-05 MED ORDER — ONDANSETRON HCL 4 MG/2ML IJ SOLN
4.0000 mg | Freq: Four times a day (QID) | INTRAMUSCULAR | Status: DC | PRN
  Administered 2011-10-05: 4 mg via INTRAVENOUS
  Filled 2011-10-05: qty 2

## 2011-10-05 MED ORDER — FENTANYL CITRATE 0.05 MG/ML IJ SOLN
INTRAMUSCULAR | Status: DC | PRN
  Administered 2011-10-05 (×2): 100 ug via INTRAVENOUS
  Administered 2011-10-05 (×3): 50 ug via INTRAVENOUS

## 2011-10-05 MED ORDER — IOHEXOL 300 MG/ML  SOLN
INTRAMUSCULAR | Status: DC | PRN
  Administered 2011-10-05: 8 mL via INTRAVENOUS

## 2011-10-05 MED ORDER — HEPARIN SODIUM (PORCINE) 5000 UNIT/ML IJ SOLN
INTRAMUSCULAR | Status: AC
  Administered 2011-10-05: 5000 [IU] via SUBCUTANEOUS
  Filled 2011-10-05: qty 1

## 2011-10-05 MED ORDER — ONDANSETRON HCL 4 MG/2ML IJ SOLN
INTRAMUSCULAR | Status: DC | PRN
  Administered 2011-10-05: 4 mg via INTRAVENOUS

## 2011-10-05 MED ORDER — ACETAMINOPHEN 10 MG/ML IV SOLN
INTRAVENOUS | Status: AC
  Filled 2011-10-05: qty 100

## 2011-10-05 MED ORDER — LACTATED RINGERS IV SOLN
INTRAVENOUS | Status: DC
  Administered 2011-10-05: 12:00:00 via INTRAVENOUS
  Administered 2011-10-05: 1000 mL via INTRAVENOUS

## 2011-10-05 MED ORDER — LACTATED RINGERS IV SOLN: INTRAVENOUS | Status: DC

## 2011-10-05 MED ORDER — ACETAMINOPHEN 10 MG/ML IV SOLN
INTRAVENOUS | Status: DC | PRN
  Administered 2011-10-05: 1000 mg via INTRAVENOUS

## 2011-10-05 MED ORDER — GLYCOPYRROLATE 0.2 MG/ML IJ SOLN
INTRAMUSCULAR | Status: DC | PRN
  Administered 2011-10-05: .5 mg via INTRAVENOUS

## 2011-10-05 MED ORDER — IOHEXOL 300 MG/ML  SOLN
INTRAMUSCULAR | Status: AC
  Filled 2011-10-05: qty 1

## 2011-10-05 MED ORDER — PROMETHAZINE HCL 25 MG/ML IJ SOLN
12.5000 mg | Freq: Four times a day (QID) | INTRAMUSCULAR | Status: DC | PRN
  Administered 2011-10-05: 12.5 mg via INTRAVENOUS
  Filled 2011-10-05: qty 1

## 2011-10-05 MED ORDER — DEXAMETHASONE SODIUM PHOSPHATE 10 MG/ML IJ SOLN
INTRAMUSCULAR | Status: DC | PRN
  Administered 2011-10-05: 10 mg via INTRAVENOUS

## 2011-10-05 MED ORDER — ROCURONIUM BROMIDE 100 MG/10ML IV SOLN
INTRAVENOUS | Status: DC | PRN
  Administered 2011-10-05: 5 mg via INTRAVENOUS
  Administered 2011-10-05: 40 mg via INTRAVENOUS
  Administered 2011-10-05 (×2): 10 mg via INTRAVENOUS

## 2011-10-05 SURGICAL SUPPLY — 41 items
APPLIER CLIP 5 13 M/L LIGAMAX5 (MISCELLANEOUS) ×2
APPLIER CLIP ROT 10 11.4 M/L (STAPLE)
BENZOIN TINCTURE PRP APPL 2/3 (GAUZE/BANDAGES/DRESSINGS) ×2 IMPLANT
CABLE HIGH FREQUENCY MONO STRZ (ELECTRODE) IMPLANT
CANISTER SUCTION 2500CC (MISCELLANEOUS) ×2 IMPLANT
CATH REDDICK CHOLANGI 4FR 50CM (CATHETERS) ×2 IMPLANT
CLIP APPLIE 5 13 M/L LIGAMAX5 (MISCELLANEOUS) ×1 IMPLANT
CLIP APPLIE ROT 10 11.4 M/L (STAPLE) IMPLANT
CLOTH BEACON ORANGE TIMEOUT ST (SAFETY) ×2 IMPLANT
COVER MAYO STAND STRL (DRAPES) ×2 IMPLANT
COVER SURGICAL LIGHT HANDLE (MISCELLANEOUS) ×2 IMPLANT
DECANTER SPIKE VIAL GLASS SM (MISCELLANEOUS) ×2 IMPLANT
DERMABOND ADVANCED (GAUZE/BANDAGES/DRESSINGS) ×1
DERMABOND ADVANCED .7 DNX12 (GAUZE/BANDAGES/DRESSINGS) ×1 IMPLANT
DRAPE C-ARM 42X72 X-RAY (DRAPES) ×2 IMPLANT
DRAPE LAPAROSCOPIC ABDOMINAL (DRAPES) ×2 IMPLANT
ELECT REM PT RETURN 9FT ADLT (ELECTROSURGICAL) ×2
ELECTRODE REM PT RTRN 9FT ADLT (ELECTROSURGICAL) ×1 IMPLANT
GLOVE BIOGEL M 8.0 STRL (GLOVE) ×2 IMPLANT
GOWN STRL NON-REIN LRG LVL3 (GOWN DISPOSABLE) ×6 IMPLANT
GOWN STRL REIN XL XLG (GOWN DISPOSABLE) ×4 IMPLANT
HEMOSTAT SURGICEL 4X8 (HEMOSTASIS) IMPLANT
IV CATH 14GX2 1/4 (CATHETERS) ×2 IMPLANT
KIT BASIN OR (CUSTOM PROCEDURE TRAY) ×2 IMPLANT
NS IRRIG 1000ML POUR BTL (IV SOLUTION) ×2 IMPLANT
POUCH SPECIMEN RETRIEVAL 10MM (ENDOMECHANICALS) ×2 IMPLANT
SCISSORS LAP 5X35 DISP (ENDOMECHANICALS) ×2 IMPLANT
SET IRRIG TUBING LAPAROSCOPIC (IRRIGATION / IRRIGATOR) ×2 IMPLANT
SLEEVE Z-THREAD 5X100MM (TROCAR) IMPLANT
SOLUTION ANTI FOG 6CC (MISCELLANEOUS) ×2 IMPLANT
STRIP CLOSURE SKIN 1/2X4 (GAUZE/BANDAGES/DRESSINGS) IMPLANT
SUT VIC AB 4-0 SH 18 (SUTURE) ×2 IMPLANT
SYR 30ML LL (SYRINGE) ×2 IMPLANT
TOWEL OR 17X26 10 PK STRL BLUE (TOWEL DISPOSABLE) ×4 IMPLANT
TRAY LAP CHOLE (CUSTOM PROCEDURE TRAY) ×2 IMPLANT
TROCAR BLADELESS OPT 5 75 (ENDOMECHANICALS) ×4 IMPLANT
TROCAR XCEL BLUNT TIP 100MML (ENDOMECHANICALS) ×2 IMPLANT
TROCAR XCEL NON-BLD 11X100MML (ENDOMECHANICALS) IMPLANT
TROCAR Z-THREAD FIOS 11X100 BL (TROCAR) ×2 IMPLANT
TROCAR Z-THREAD FIOS 5X100MM (TROCAR) ×2 IMPLANT
TUBING INSUFFLATION 10FT LAP (TUBING) ×2 IMPLANT

## 2011-10-05 NOTE — Anesthesia Preprocedure Evaluation (Signed)
Anesthesia Evaluation  Patient identified by MRN, date of birth, ID band Patient awake    Reviewed: Allergy & Precautions, H&P , NPO status , Patient's Chart, lab work & pertinent test results  Airway Mallampati: II TM Distance: >3 FB Neck ROM: full    Dental No notable dental hx. (+) Teeth Intact and Dental Advisory Given   Pulmonary neg pulmonary ROS, asthma ,  Remote asthma.  No problems now. breath sounds clear to auscultation  Pulmonary exam normal       Cardiovascular Exercise Tolerance: Good negative cardio ROS  Rhythm:regular Rate:Normal     Neuro/Psych negative neurological ROS  negative psych ROS   GI/Hepatic negative GI ROS, Neg liver ROS, GERD-  Medicated and Controlled,  Endo/Other  negative endocrine ROS  Renal/GU negative Renal ROS  negative genitourinary   Musculoskeletal   Abdominal   Peds  Hematology negative hematology ROS (+) Anemia Hgb. 8   Anesthesia Other Findings   Reproductive/Obstetrics negative OB ROS                           Anesthesia Physical Anesthesia Plan  ASA: I  Anesthesia Plan: General   Post-op Pain Management:    Induction: Intravenous  Airway Management Planned: Oral ETT  Additional Equipment:   Intra-op Plan:   Post-operative Plan: Extubation in OR  Informed Consent: I have reviewed the patients History and Physical, chart, labs and discussed the procedure including the risks, benefits and alternatives for the proposed anesthesia with the patient or authorized representative who has indicated his/her understanding and acceptance.   Dental Advisory Given  Plan Discussed with: CRNA and Surgeon  Anesthesia Plan Comments:         Anesthesia Quick Evaluation

## 2011-10-05 NOTE — Progress Notes (Signed)
Patient c/o nausea not relieved with zofran. Also c/o pain behind left knee, stated similar to past history of pain in right leg d/t blood clot in 2008, homans' sign negative, no redness or warmth to area. Dr. Luisa Hart on call notified of both. Order given for phergan for nausea and patient is currently receiving heparin twice a day.

## 2011-10-05 NOTE — Progress Notes (Signed)
Pt complain of nausea will contact physician for order for antiemetic. Annitta Needs, RN

## 2011-10-05 NOTE — Progress Notes (Signed)
Patient took enema last night per order with results. Heparin sq given in pre-op per order by RN.

## 2011-10-05 NOTE — Op Note (Signed)
[name] @date @  Procedure: Laparoscopic Cholecystectomy with intraoperative cholangiogram  Surgeon: Wenda Low, MD, FACS Asst:  none  Anes:  General  Drains: None  Findings: Chronic cholecystitis, old PID adhesions to liver, hundreds of small cholesterol stones, normal IOC  Description of Procedure: The patient was taken to OR 1  and given general anesthesia.  The patient was prepped with PCMX and draped sterilely. A time out was performed.  Access to the abdomen was achieved with the 5 mm Optiview through the RUQ.  Port placement included an 11 in the upper midline and the rest 5 mm..    The gallbladder was visualized and the fundus was grasped and the gallbladder was elevated. Traction on the infundibulum allowed for successful demonstration of the critical view. Inflammatory changes were chronic.  The cystic duct was identified and clipped up on the gallbladder and an incision was made in the cystic duct and the Reddick catheter was inserted after milking the cystic duct of any debris. A dynamic cholangiogram was performed which demonstrated prompt filling of the CBD with intrahepatic filling and free flow into the duodenum.    The cystic duct was then triple clipped and divided, the cystic artery was double clipped and divided and then the gallbladder was removed from the gallbladder bed. Removal of the gallbladder from the gallbladder bed was tedious because of intrahepatic position but no openings or drainage occurred.  The gallbladder was then placed in a bag and brought out through one of the 10 mm trocar sites. The gallbladder bed was inspected and no bleeding or bile leaks were seen.     Incisions were injected with marcaine and closed with 4-0 Vicryl and Dermabond on the skin.  Sponge and needle count were correct.    The patient was taken to the recovery room in satisfactory condition.

## 2011-10-05 NOTE — Transfer of Care (Signed)
Immediate Anesthesia Transfer of Care Note  Patient: [name]  Procedure(s) Performed: Procedure(s) (LRB): LAPAROSCOPIC CHOLECYSTECTOMY WITH INTRAOPERATIVE CHOLANGIOGRAM (N/A)  Patient Location: PACU  Anesthesia Type: General  Level of Consciousness: awake, alert , oriented and patient cooperative  Airway & Oxygen Therapy: Patient Spontanous Breathing and Patient connected to face mask oxygen  Post-op Assessment: Report given to PACU RN and Post -op Vital signs reviewed and stable  Post vital signs: Reviewed and stable  Complications: No apparent anesthesia complications

## 2011-10-05 NOTE — Anesthesia Postprocedure Evaluation (Signed)
  Anesthesia Post-op Note  Patient: [name]  Procedure(s) Performed: Procedure(s) (LRB): LAPAROSCOPIC CHOLECYSTECTOMY WITH INTRAOPERATIVE CHOLANGIOGRAM (N/A)  Patient Location: PACU  Anesthesia Type: General  Level of Consciousness: awake and alert   Airway and Oxygen Therapy: Patient Spontanous Breathing  Post-op Pain: mild  Post-op Assessment: Post-op Vital signs reviewed, Patient's Cardiovascular Status Stable, Respiratory Function Stable, Patent Airway and No signs of Nausea or vomiting  Post-op Vital Signs: stable  Complications: No apparent anesthesia complications

## 2011-10-06 ENCOUNTER — Encounter (HOSPITAL_COMMUNITY): Payer: Self-pay | Admitting: Surgery

## 2011-10-06 DIAGNOSIS — M79609 Pain in unspecified limb: Secondary | ICD-10-CM

## 2011-10-06 LAB — DIFFERENTIAL
Eosinophils Absolute: 0 10*3/uL (ref 0.0–0.7)
Eosinophils Relative: 0 % (ref 0–5)
Lymphocytes Relative: 17 % (ref 12–46)
Monocytes Absolute: 1 10*3/uL (ref 0.1–1.0)
Neutrophils Relative %: 75 % (ref 43–77)

## 2011-10-06 LAB — FERRITIN: Ferritin: 3 ng/mL — ABNORMAL LOW (ref 10–291)

## 2011-10-06 LAB — CBC
MCH: 17.3 pg — ABNORMAL LOW (ref 26.0–34.0)
Platelets: 399 10*3/uL (ref 150–400)
RBC: 4.22 MIL/uL (ref 3.87–5.11)

## 2011-10-06 LAB — RETICULOCYTES
Retic Count, Absolute: 84.4 10*3/uL (ref 19.0–186.0)
Retic Ct Pct: 2 % (ref 0.4–3.1)

## 2011-10-06 LAB — FOLATE: Folate: 15 ng/mL

## 2011-10-06 LAB — IRON AND TIBC: UIBC: 482 ug/dL — ABNORMAL HIGH (ref 125–400)

## 2011-10-06 MED ORDER — OXYCODONE-ACETAMINOPHEN 5-325 MG/5ML PO SOLN: 5.0000 mL | ORAL | Status: DC | PRN

## 2011-10-06 NOTE — Progress Notes (Signed)
*  PRELIMINARY RESULTS* Vascular Ultrasound Left Lower Extremity Venous Duplex has been completed.  Preliminary findings: Left= No evidence of DVT or baker's cyst.  Farrel Demark, RDMS 10/06/2011, 2:34 PM

## 2011-10-06 NOTE — Discharge Instructions (Signed)
Iron Deficiency Anemia There are many types of anemia. Iron deficiency anemia is the most common. Iron deficiency anemia is a decrease in the number of red blood cells caused by too little iron. Without enough iron, your body does not produce enough hemoglobin. Hemoglobin is a substance in red blood cells that carries oxygen to the body's tissues. Iron deficiency anemia may leave you tired and short of breath. CAUSES   Lack of iron in the diet.   This may be seen in infants and children, because there is little iron in milk.   This may be seen in adults who do not eat enough iron-rich foods.   This may be seen in pregnant or breastfeeding women who do not take iron supplements. There is a much higher need for iron intake at these times.   Poor absorption of iron, as seen with intestinal disorders.   Intestinal bleeding.   Heavy periods.  SYMPTOMS  Mild anemia may not be noticeable. Symptoms may include:  Fatigue.   Headache.   Pale skin.   Weakness.   Shortness of breath.   Dizziness.   Cold hands and feet.   Fast or irregular heartbeat.  DIAGNOSIS  Diagnosis requires a thorough evaluation and physical exam by your caregiver.  Blood tests are generally used to confirm iron deficiency anemia.   Additional tests may be done to find the underlying cause of your anemia. These may include:   Testing for blood in the stool (fecal occult blood test).   A procedure to see inside the colon and rectum (colonoscopy).   A procedure to see inside the esophagus and stomach (endoscopy).  TREATMENT   Correcting the cause of the iron deficiency is the first step.   Medicines, such as oral contraceptives, can make heavy menstrual flows lighter.   Antibiotics and other medicines can be used to treat peptic ulcers.   Surgery may be needed to remove a bleeding polyp, tumor, or fibroid.   Often, iron supplements (ferrous sulfate) are taken.   For the best iron absorption, take  these supplements with an empty stomach.   You may need to take the supplements with food if you cannot tolerate them on an empty stomach. Vitamin C improves the absorption of iron. Your caregiver may recommend taking your iron tablets with a glass of orange juice or vitamin C supplement.   Milk and antacids should not be taken at the same time as iron supplements. They may interfere with the absorption of iron.   Iron supplements can cause constipation. A stool softener is often recommended.   Pregnant and breastfeeding women will need to take extra iron, because their normal diet usually will not provide the required amount.   Patients who cannot tolerate iron by mouth can take it through a vein (intravenously) or by an injection into the muscle.  HOME CARE INSTRUCTIONS   Ask your dietitian for help with diet questions.   Take iron and vitamins as directed by your caregiver.   Eat a diet rich in iron. Eat liver, lean beef, whole-grain bread, eggs, dried fruit, and dark green leafy vegetables.  SEEK IMMEDIATE MEDICAL CARE IF:   You have a fainting episode. Do not drive yourself. Call your local emergency services (911 in [place] if no other help is available.   You have chest pain, nausea, or vomiting.   You develop severe or increased shortness of breath with activities.   You develop weakness or increased thirst.   You have   a rapid heartbeat.   You develop unexplained sweating or become lightheaded when getting up from a chair or bed.  MAKE SURE YOU:   Understand these instructions.   Will watch your condition.   Will get help right away if you are not doing well or get worse.  Document Released: 05/20/2000 Document Revised: 05/12/2011 Document Reviewed: 09/29/2009 [facility] Patient Information 2012 [place], [place].Laparoscopic Cholecystectomy Laparoscopic cholecystectomy is surgery to remove the gallbladder. The gallbladder is located slightly to the right of center in the  abdomen, behind the liver. It is a concentrating and storage sac for the bile produced in the liver. Bile aids in the digestion and absorption of fats. Gallbladder disease (cholecystitis) is an inflammation of your gallbladder. This condition is usually caused by a buildup of gallstones (cholelithiasis) in your gallbladder. Gallstones can block the flow of bile, resulting in inflammation and pain. In severe cases, emergency surgery may be required. When emergency surgery is not required, you will have time to prepare for the procedure. Laparoscopic surgery is an alternative to open surgery. Laparoscopic surgery usually has a shorter recovery time. Your common bile duct may also need to be examined and explored. Your caregiver will discuss this with you if he or she feels this should be done. If stones are found in the common bile duct, they may be removed. LET YOUR CAREGIVER KNOW ABOUT:  Allergies to food or medicine.   Medicines taken, including vitamins, herbs, eyedrops, over-the-counter medicines, and creams.   Use of steroids (by mouth or creams).   Previous problems with anesthetics or numbing medicines.   History of bleeding problems or blood clots.   Previous surgery.   Other health problems, including diabetes and kidney problems.   Possibility of pregnancy, if this applies.  RISKS AND COMPLICATIONS All surgery is associated with risks. Some problems that may occur following this procedure include:  Infection.   Damage to the common bile duct, nerves, arteries, veins, or other internal organs such as the stomach or intestines.   Bleeding.   A stone may remain in the common bile duct.  BEFORE THE PROCEDURE  Do not take aspirin for 3 days prior to surgery or blood thinners for 1 week prior to surgery.   Do not eat or drink anything after midnight the night before surgery.   Let your caregiver know if you develop a cold or other infectious problem prior to surgery.   You  should be present 60 minutes before the procedure or as directed.  PROCEDURE  You will be given medicine that makes you sleep (general anesthetic). When you are asleep, your surgeon will make several small cuts (incisions) in your abdomen. One of these incisions is used to insert a small, lighted scope (laparoscope) into the abdomen. The laparoscope helps the surgeon see into your abdomen. Carbon dioxide gas will be pumped into your abdomen. The gas allows more room for the surgeon to perform your surgery. Other operating instruments are inserted through the other incisions. Laparoscopic procedures may not be appropriate when:  There is major scarring from previous surgery.   The gallbladder is extremely inflamed.   There are bleeding disorders or unexpected cirrhosis of the liver.   A pregnancy is near term.   Other conditions make the laparoscopic procedure impossible.  If your surgeon feels it is not safe to continue with a laparoscopic procedure, he or she will perform an open abdominal procedure. In this case, the surgeon will make an incision to open the  abdomen. This gives the surgeon a larger view and field to work within. This may allow the surgeon to perform procedures that sometimes cannot be performed with a laparoscope alone. Open surgery has a longer recovery time. AFTER THE PROCEDURE  You will be taken to the recovery area where a nurse will watch and check your progress.   You may be allowed to go home the same day.   Do not resume physical activities until directed by your caregiver.   You may resume a normal diet and activities as directed.  Document Released: 05/23/2005 Document Revised: 05/12/2011 Document Reviewed: 11/05/2010 [facility] Patient Information 2012 [place], [place].

## 2011-10-06 NOTE — Discharge Summary (Signed)
Physician Discharge Summary  Patient ID: [name] MRN: [id number] DOB/AGE: [id number] y.o.  Admit date: 10/05/2011 Discharge date: 10/06/2011  Admission Diagnoses:  Chronic cholecystitis  Discharge Diagnoses:  same  Active Problems:  * No active hospital problems. *    Surgery:  Lap chole with IOC  Discharged Condition: better  Hospital Course:   Had surgery.  Sore in upper midline where GB packed with stones was removed.  HG low.  Anemia workup drawn before discharge.   Consults: none  Significant Diagnostic Studies: anemia workup    Discharge Exam: Blood pressure 132/70, pulse 86, temperature 98.2 F (36.8 C), temperature source Oral, resp. rate 18, height 5\' 1"  (1.549 m), weight 188 lb (85.276 kg), SpO2 97.00%. Sore in upper abdomen  Disposition: 01-Home or Self Care  Discharge Orders    Future Orders Please Complete By Expires   Diet Carb Modified      Increase activity slowly      Discharge instructions      Comments:   May shower when home.     Medication List  As of 10/06/2011  7:37 AM   TAKE these medications         acetaminophen 325 MG tablet   Commonly known as: TYLENOL   Take 325-650 mg by mouth every 6 (six) hours as needed. Pain      ferrous sulfate 325 (65 FE) MG EC tablet   Take 325 mg by mouth 2 (two) times a week.      HYOMAX-SR 0.375 MG 12 hr tablet   Generic drug: hyoscyamine   Take 0.375 mg by mouth every 12 (twelve) hours as needed. Bladder spasm        Ibuprofen 200 MG Caps   Take 400 mg by mouth every 8 (eight) hours as needed. as needed for pain      omeprazole 40 MG capsule   Commonly known as: PRILOSEC   TAKE ONE CAPSULE EACH DAY 30 MINUTES BEFORE BREAKFAST.      oxyCODONE-acetaminophen 5-325 MG/5ML solution   Commonly known as: ROXICET   Take 5 mLs by mouth every 4 (four) hours as needed for pain.      TUMS PO   Take 2 tablets by mouth every 4 (four) hours as needed. Heart burn             Follow-up  Information    Follow up with Luretha Murphy B, MD in 2 weeks.   Contact information:   [employer] [address] [place] [address] [phone]          Signed: Valarie Merino 10/06/2011, 7:37 AM

## 2011-10-08 ENCOUNTER — Emergency Department (HOSPITAL_COMMUNITY): Payer: BC Managed Care – PPO

## 2011-10-08 ENCOUNTER — Emergency Department (HOSPITAL_COMMUNITY)
Admission: EM | Admit: 2011-10-08 | Discharge: 2011-10-08 | Disposition: A | Discharge status: Home / Self Care | Payer: BC Managed Care – PPO | Attending: Emergency Medicine | Admitting: Emergency Medicine

## 2011-10-08 ENCOUNTER — Encounter (HOSPITAL_COMMUNITY): Payer: Self-pay | Admitting: Emergency Medicine

## 2011-10-08 DIAGNOSIS — Z86718 Personal history of other venous thrombosis and embolism: Secondary | ICD-10-CM | POA: Insufficient documentation

## 2011-10-08 DIAGNOSIS — R112 Nausea with vomiting, unspecified: Secondary | ICD-10-CM | POA: Insufficient documentation

## 2011-10-08 DIAGNOSIS — R1013 Epigastric pain: Secondary | ICD-10-CM | POA: Insufficient documentation

## 2011-10-08 DIAGNOSIS — J45909 Unspecified asthma, uncomplicated: Secondary | ICD-10-CM | POA: Insufficient documentation

## 2011-10-08 DIAGNOSIS — G8918 Other acute postprocedural pain: Secondary | ICD-10-CM | POA: Insufficient documentation

## 2011-10-08 LAB — URINALYSIS, ROUTINE W REFLEX MICROSCOPIC
Bilirubin Urine: NEGATIVE
Leukocytes, UA: NEGATIVE
Nitrite: NEGATIVE
Specific Gravity, Urine: 1.013 (ref 1.005–1.030)
pH: 7.5 (ref 5.0–8.0)

## 2011-10-08 LAB — COMPREHENSIVE METABOLIC PANEL
AST: 416 U/L — ABNORMAL HIGH (ref 0–37)
Albumin: 3.9 g/dL (ref 3.5–5.2)
Calcium: 9.2 mg/dL (ref 8.4–10.5)
Creatinine, Ser: 0.66 mg/dL (ref 0.50–1.10)
GFR calc non Af Amer: >90 mL/min (ref >90)

## 2011-10-08 LAB — DIFFERENTIAL
Eosinophils Relative: 1 % (ref 0–5)
Lymphocytes Relative: 27 % (ref 12–46)
Lymphs Abs: 1.5 10*3/uL (ref 0.7–4.0)
Monocytes Relative: 7 % (ref 3–12)

## 2011-10-08 LAB — CBC
HCT: 28.4 % — ABNORMAL LOW (ref 36.0–46.0)
MCV: 59.4 fL — ABNORMAL LOW (ref 78.0–100.0)
Platelets: 435 10*3/uL — ABNORMAL HIGH (ref 150–400)
RBC: 4.78 MIL/uL (ref 3.87–5.11)
WBC: 5.6 10*3/uL (ref 4.0–10.5)

## 2011-10-08 LAB — POCT PREGNANCY, URINE: Preg Test, Ur: NEGATIVE

## 2011-10-08 MED ORDER — OXYCODONE HCL 5 MG PO CAPS: 5.0000 mg | ORAL_CAPSULE | ORAL | Status: AC | PRN

## 2011-10-08 MED ORDER — ONDANSETRON HCL 4 MG PO TABS: 8.0000 mg | ORAL_TABLET | Freq: Three times a day (TID) | ORAL | Status: AC | PRN

## 2011-10-08 MED ORDER — SODIUM CHLORIDE 0.9 % IV BOLUS (SEPSIS)
1000.0000 mL | Freq: Once | INTRAVENOUS | Status: AC
  Administered 2011-10-08: 1000 mL via INTRAVENOUS

## 2011-10-08 MED ORDER — OXYCODONE HCL 5 MG PO CAPS: 5.0000 mg | ORAL_CAPSULE | ORAL | Status: DC | PRN

## 2011-10-08 MED ORDER — ONDANSETRON 8 MG PO TBDP
8.0000 mg | ORAL_TABLET | Freq: Once | ORAL | Status: AC
  Administered 2011-10-08: 8 mg via ORAL
  Filled 2011-10-08: qty 1

## 2011-10-08 MED ORDER — ONDANSETRON HCL 4 MG PO TABS: 8.0000 mg | ORAL_TABLET | Freq: Three times a day (TID) | ORAL | Status: DC | PRN

## 2011-10-08 MED ORDER — ONDANSETRON HCL 4 MG/2ML IJ SOLN
4.0000 mg | Freq: Once | INTRAMUSCULAR | Status: AC
  Administered 2011-10-08: 4 mg via INTRAVENOUS
  Filled 2011-10-08: qty 2

## 2011-10-08 MED ORDER — IBUPROFEN 200 MG PO TABS
400.0000 mg | ORAL_TABLET | Freq: Once | ORAL | Status: AC
  Administered 2011-10-08: 400 mg via ORAL
  Filled 2011-10-08: qty 2

## 2011-10-08 MED ORDER — IOHEXOL 300 MG/ML  SOLN
100.0000 mL | Freq: Once | INTRAMUSCULAR | Status: AC | PRN
  Administered 2011-10-08: 100 mL via INTRAVENOUS

## 2011-10-08 MED ORDER — SODIUM CHLORIDE 0.9 % IV SOLN: INTRAVENOUS | Status: DC

## 2011-10-08 MED ORDER — HYDROMORPHONE HCL PF 1 MG/ML IJ SOLN
1.0000 mg | Freq: Once | INTRAMUSCULAR | Status: AC
  Administered 2011-10-08: 1 mg via INTRAVENOUS
  Filled 2011-10-08: qty 1

## 2011-10-08 NOTE — Discharge Instructions (Signed)
Pain Relief Preoperatively and Postoperatively Being a good patient does not mean being a silent one.If you have questions, problems, or concerns about the pain you may feel after surgery, let your caregiver know.Patients have the right to assessment and management of pain. The treatment of pain after surgery is important to speed up recovery and return to normal activities. Severe pain after surgery, and the fear or anxiety associated with that pain, may cause extreme discomfort that:  Prevents sleep.   Decreases the ability to breathe deeply and cough. This can cause pneumonia or other upper airway infections.   Causes your heart to beat faster and your blood pressure to be higher.   Increases the risk for constipation and bloating.   Decreases the ability of wounds to heal.   May result in depression, increased anxiety, and feelings of helplessness.  Relief of pain before surgery is also important because it will lessen the pain after surgery. Patients who receive both pain relief before and after surgery experience greater pain relief than those who only receive pain relief after surgery. Let your caregiver know if you are having uncontrolled pain.This is very important.Pain after surgery is more difficult to manage if it is permitted to become severe, so prompt and adequate treatment of acute pain is necessary. PAIN CONTROL METHODS Your caregivers follow policies and procedures about the management of patient pain.These guidelines should be explained to you before surgery.Plans for pain control after surgery must be mutually decided upon and instituted with your full understanding and agreement.Do not be afraid to ask questions regarding the care you are receiving.There are many different ways your caregivers will attempt to control your pain, including the following methods. As needed pain control  You may be given pain medicine either through your intravenous (IV) tube, or as a pill  or liquid you can swallow. You will need to let your caregiver know when you are having pain. Then, your caregiver will give you the pain medicine ordered for you.   Your pain medicine may make you constipated. If constipation occurs, drink more liquids if you can. Your caregiver may have you take a mild laxative.  IV patient-controlled analgesia pump (PCA pump)  You can get your pain medicine through the IV tube which goes into your vein. You are able to control the amount of pain medicine that you get. The pain medicine flows in through an IV tube and is controlled by a pump. This pump gives you a set amount of pain medicine when you push the button hooked up to it. Nobody should push this button but you or someone specifically assigned by you to do so. It is set up to keep you from accidentally giving yourself too much pain medicine. You will be able to start using your pain pump in the recovery room after your surgery. This method can be helpful for most types of surgery.   If you are still having too much pain, tell your caregiver. Also, tell your caregiver if you are feeling too sleepy or nauseous.  Continuous epidural pain control  A thin, soft tube (catheter) is put into your back. Pain medicine flows through the catheter to lessen pain in the part of your body where the surgery is done. Continuous epidural pain control may work best for you if you are having surgery on your chest, abdomen, hip area, or legs. The epidural catheter is usually put into your back just before surgery. The catheter is left in until you   can eat and take medicine by mouth. In most cases, this may take 2 to 3 days.   Giving pain medicine through the epidural catheter may help you heal faster because:   Your bowel gets back to normal faster.   You can get back to eating sooner.   You can be up and walking sooner.  Medicine that numbs the area (local anesthetic)  You may receive an injection of pain medicine near  where the pain is (local infiltration).   You may receive an injection of pain medicine near the nerve that controls the sensation to a specific part of the body (peripheral nerve block).   Medicine may be put in the spine to block pain (spinal block).  Opioids  Moderate to moderately severe acute pain after surgery may respond to opioids.Opioids are narcotic pain medicine. Opioids are often combined with non-narcotic medicines to improve pain relief, diminish the risk of side effects, and reduce the chance of addiction.   If you follow your caregiver's directions about taking opioids and you do not have a history of substance abuse, your risk of becoming addicted is exceptionally small.Opioids are given for short periods of time in careful doses to prevent addiction.  Other methods of pain control include:  Steroids.   Physical therapy.   Heat and cold therapy.   Compression, such as wrapping an elastic bandage around the area of pain.   Massage.  These various ways of controlling pain may be used together. Combining different methods of pain control is called multimodal analgesia. Using this approach has many benefits, including being able to eat, move around, and leave the hospital sooner. Document Released: 08/13/2002 Document Revised: 05/12/2011 Document Reviewed: 08/17/2010 [facility] Patient Information 2012 [place], [place].     Stop Roxicet. Take the pain medicine prescribed today for severe pain or Advil for mild pain. Avoid Tylenol as it can affect your liver. Call Dr. Daphine Deutscher in 2 days to schedule office appointment for next week. Return if her condition worsens for any reason

## 2011-10-08 NOTE — ED Provider Notes (Signed)
History     CSN: [id number]  Arrival date & time 10/08/11  1503   First MD Initiated Contact with Patient 10/08/11 1541      Chief Complaint  Patient presents with  . Nausea  . Emesis    (Consider location/radiation/quality/duration/timing/severity/associated sxs/prior treatment) HPI Complains of epigastric pain nonradiating moderate to severe constant, sharp in quality onset 3 days ago post surgery accompanied by multiple episodes of vomiting yellowish green material for the past 2 days maximum temperature 99.9 degrees no other complaint treated with Roxicet without relief. Nothing makes symptoms better or worse no other associated symptoms. Patient is status post laparoscopic cholecystectomy 10/05/2011 Past Medical History  Diagnosis Date  . Heartburn   . Epigastric pain   . Gallstones   . Iron deficiency anemia   . Asthma     IN PAST NO RECENT PROBLEMS  . Headache   . Blood clot in vein 2008    right leg    Past Surgical History  Procedure Date  . Tubal reversal   . Cholecystectomy 10/05/2011    Procedure: LAPAROSCOPIC CHOLECYSTECTOMY WITH INTRAOPERATIVE CHOLANGIOGRAM;  Surgeon: Valarie Merino, MD;  Location: [facility];  Service: General;  Laterality: N/A;    Family History  Problem Relation Age of Onset  . Diabetes      Family History  . Colon cancer Neg Hx     History  Substance Use Topics  . Smoking status: Never Smoker   . Smokeless tobacco: Never Used  . Alcohol Use: Yes     RARE    OB History    Grav Para Term Preterm Abortions TAB SAB Ect Mult Living                  Review of Systems  Constitutional: Negative.   HENT: Negative.   Respiratory: Negative.   Cardiovascular: Negative.   Gastrointestinal: Positive for nausea, vomiting and abdominal pain.  Musculoskeletal: Negative.   Skin: Negative.   Neurological: Negative.   Hematological: Negative.   Psychiatric/Behavioral: Negative.   All other systems reviewed and are  negative.    Allergies  Review of patient's allergies indicates no known allergies.  Home Medications   Current Outpatient Rx  Name Route Sig Dispense Refill  . ACETAMINOPHEN 325 MG PO TABS Oral Take 325-650 mg by mouth every 6 (six) hours as needed. Pain    . HYOSCYAMINE SULFATE ER 0.375 MG PO TB12 Oral Take 0.375 mg by mouth every 12 (twelve) hours as needed. Bladder spasm     . IBUPROFEN 200 MG PO CAPS Oral Take 400 mg by mouth every 8 (eight) hours as needed. as needed for pain    . OXYCODONE-ACETAMINOPHEN 5-325 MG/5ML PO SOLN Oral Take 5 mLs by mouth every 4 (four) hours as needed for pain. 200 mL 0    BP 142/86  Pulse 90  Temp(Src) 98.4 F (36.9 C) (Oral)  Resp 20  SpO2 100%  LMP 09/18/2011  Physical Exam  Nursing note and vitals reviewed. Constitutional: She appears well-developed and well-nourished.  HENT:  Head: Normocephalic and atraumatic.  Eyes: Conjunctivae are normal. Pupils are equal, round, and reactive to light.  Neck: Neck supple. No tracheal deviation present. No thyromegaly present.  Cardiovascular: Normal rate and regular rhythm.   No murmur heard. Pulmonary/Chest: Effort normal and breath sounds normal.  Abdominal: Soft. Bowel sounds are normal. She exhibits no distension and no mass. There is tenderness. There is no rebound and no guarding.  Obese Tender at epigastric area  Musculoskeletal: Normal range of motion. She exhibits no edema and no tenderness.  Neurological: She is alert. Coordination normal.  Skin: Skin is warm and dry. No rash noted.  Psychiatric: She has a normal mood and affect.    ED Course  Procedures (including critical care time)  Labs Reviewed - No data to display No results found. Feels improved after treatment with intravenous narcotics, fluids and antiemetics. 9:30 PM patient complains only of mild headache and nausea. She feels ready to home.. No diagnosis found. Results for orders placed during the hospital  encounter of 10/08/11  COMPREHENSIVE METABOLIC PANEL      Component Value Range   Sodium 133 (*) 135 - 145 (mEq/L)   Potassium 3.6  3.5 - 5.1 (mEq/L)   Chloride 97  96 - 112 (mEq/L)   CO2 27  19 - 32 (mEq/L)   Glucose, Bld 111 (*) 70 - 99 (mg/dL)   BUN 6  6 - 23 (mg/dL)   Creatinine, Ser 4.09  0.50 - 1.10 (mg/dL)   Calcium 9.2  8.4 - 81.1 (mg/dL)   Total Protein 8.2  6.0 - 8.3 (g/dL)   Albumin 3.9  3.5 - 5.2 (g/dL)   AST 914 (*) 0 - 37 (U/L)   ALT 441 (*) 0 - 35 (U/L)   Alkaline Phosphatase 109  39 - 117 (U/L)   Total Bilirubin 0.9  0.3 - 1.2 (mg/dL)   GFR calc non Af Amer >90  >90 (mL/min)   GFR calc Af Amer >90  >90 (mL/min)  LIPASE, BLOOD      Component Value Range   Lipase 26  11 - 59 (U/L)  CBC      Component Value Range   WBC 5.6  4.0 - 10.5 (K/uL)   RBC 4.78  3.87 - 5.11 (MIL/uL)   Hemoglobin 8.3 (*) 12.0 - 15.0 (g/dL)   HCT 78.2 (*) 95.6 - 46.0 (%)   MCV 59.4 (*) 78.0 - 100.0 (fL)   MCH 17.4 (*) 26.0 - 34.0 (pg)   MCHC 29.2 (*) 30.0 - 36.0 (g/dL)   RDW 21.3 (*) 08.6 - 15.5 (%)   Platelets 435 (*) 150 - 400 (K/uL)  DIFFERENTIAL      Component Value Range   Neutrophils Relative 65  43 - 77 (%)   Lymphocytes Relative 27  12 - 46 (%)   Monocytes Relative 7  3 - 12 (%)   Eosinophils Relative 1  0 - 5 (%)   Basophils Relative 0  0 - 1 (%)   Neutro Abs 3.6  1.7 - 7.7 (K/uL)   Lymphs Abs 1.5  0.7 - 4.0 (K/uL)   Monocytes Absolute 0.4  0.1 - 1.0 (K/uL)   Eosinophils Absolute 0.1  0.0 - 0.7 (K/uL)   Basophils Absolute 0.0  0.0 - 0.1 (K/uL)   RBC Morphology ELLIPTOCYTES    URINALYSIS, ROUTINE W REFLEX MICROSCOPIC      Component Value Range   Color, Urine YELLOW  YELLOW    APPearance CLEAR  CLEAR    Specific Gravity, Urine 1.013  1.005 - 1.030    pH 7.5  5.0 - 8.0    Glucose, UA NEGATIVE  NEGATIVE (mg/dL)   Hgb urine dipstick NEGATIVE  NEGATIVE    Bilirubin Urine NEGATIVE  NEGATIVE    Ketones, ur NEGATIVE  NEGATIVE (mg/dL)   Protein, ur NEGATIVE  NEGATIVE (mg/dL)    Urobilinogen, UA 1.0  0.0 - 1.0 (mg/dL)  Nitrite NEGATIVE  NEGATIVE    Leukocytes, UA NEGATIVE  NEGATIVE   POCT PREGNANCY, URINE      Component Value Range   Preg Test, Ur NEGATIVE  NEGATIVE    Dg Cholangiogram Operative  10/05/2011  *RADIOLOGY REPORT*  Clinical Data:   Cholelithiasis  INTRAOPERATIVE CHOLANGIOGRAM  Technique:  Cholangiographic images from the C-arm fluoroscopic device were submitted for interpretation post-operatively.  Please see the procedural report for the amount of contrast and the fluoroscopy time utilized.  Comparison:  Ultrasound 10/07/2010  Findings:  Multiple fluoroscopic intraoperative images are provided.  Initial image demonstrates cannulation of the cystic duct.  Injection of contrast fills the common bile duct and flows into the duodenum without evidence of obstruction.  There is no persistent filling defects within the common bile duct.  No intrahepatic biliary duct dilatation.  IMPRESSION: No evidence of filling defects within the common bile duct or obstruction of the common bile duct.  Original Report Authenticated By: Genevive Bi, M.D.   Ct Abdomen Pelvis W Contrast  10/08/2011  *RADIOLOGY REPORT*  Clinical Data: 41 year old female with nausea and vomiting.  Recent gallbladder surgery.  Shortness of breath.  CT ABDOMEN AND PELVIS WITH CONTRAST  Technique:  Multidetector CT imaging of the abdomen and pelvis was performed following the standard protocol during bolus administration of intravenous contrast.  Contrast: OMNIPAQUE IOHEXOL 300 MG/ML  SOLN  Comparison: CT abdomen pelvis 05/08/2008.  Intraoperative cholangiogram 10/05/2011.  Findings: Minor atelectasis at the lung bases.  No pericardial or pleural effusion. No acute osseous abnormality identified.   trace pelvic free fluid similar to the 2009 study.  Low density in the endometrium may be related to menses.  Adnexa within normal limits for age, mild low density enlargement on the left, decreased from  prior.  Decompressed bladder.  Negative distal colon.  Right colon is within normal limits.  Normal appendix.  No dilated small bowel. Oral contrast has now reached the distal small bowel.  The stomach is distended with contrast, otherwise within normal limits.  Surgical clips and mild postoperative changes in the gallbladder fossa.  Trace extraluminal gas within normal limits.  No non dependent pneumoperitoneum.  Liver enhancement within normal limits.  No perihepatic fluid.  Portal venous system within normal limits.  No stranding at the porta hepatis.  Negative spleen, pancreas, adrenal glands, kidneys, and major arterial structures. Ventral abdomen incision site appears satisfactory with only trace subcutaneous gas and stranding.  IMPRESSION: Satisfactory appearance of the abdomen pelvis status post recent cholecystectomy.  No acute findings.  Original Report Authenticated By: Harley Hallmark, M.D.    MDM  Dr. Dwain Sarna evaluated patient in the emergency department and deemed to her stable for discharge if CT scan shows nothing acute In light of elevated liver function tests we'll discontinue Roxicet Prescribe OxyIR,, Zofran Advil for mild pain Patient to followup with Dr. Luretha Murphy in the office next week  Diagnosis #1 postoperative pain and vomiting Diagnosis #2 nonspecific headache Redness #3 elevated liver function test     Doug Sou, MD 10/08/11 2135

## 2011-10-08 NOTE — ED Notes (Signed)
Pt presenting to ed with c/o nausea and vomiting. Pt states she had gallbladder x 3 days ago and was discharge Thursday. Pt states pain medicine she received isn't helping. Pt states she feels lightheaded. Pt states positive shortness of breath no chest pain at this time. Pt is alert and oriented at this time.

## 2011-10-10 LAB — HEMOGLOBINOPATHY EVALUATION: Hgb A2 Quant: 1.9 % — ABNORMAL LOW (ref 2.2–3.2)

## 2011-10-14 ENCOUNTER — Telehealth (INDEPENDENT_AMBULATORY_CARE_PROVIDER_SITE_OTHER): Payer: Self-pay | Admitting: General Surgery

## 2011-10-14 NOTE — Telephone Encounter (Signed)
Called patient back on cell phone [phone] (as requested) based on message left earlier this morning. Had to leave message on cell phone for patient to call back to obtain appointment that has been set up for 10/20/11 @ 10:30. Post op lap chole with Dr. Daphine Deutscher. Did not have permission to leave more detail on message from patient.

## 2011-10-14 NOTE — Telephone Encounter (Signed)
Patient called back and was given the appointment day and time for next week. Patient stated there are no signs of infection or drainage of any kind at this time. Patient did have nausea after surgery, but is doing fine now. Patient stated she did have a small opening, but nothing was coming from it, no drainage/discharge or odor. I advised her to call our office if the site changes for any reason. In addition I advised her to pay attention to the clothing she wears to make sure they do not rub against the incision sites (lap chole sx 10/05/11). 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 Patient has requested a return to work note for her job. I advised that the detail/restrictions will be covered with Dr. Daphine Deutscher at visit and he will indicate any restrictions as needed. Patient agreed.

## 2011-10-20 ENCOUNTER — Encounter (INDEPENDENT_AMBULATORY_CARE_PROVIDER_SITE_OTHER): Payer: Self-pay | Admitting: Surgery

## 2011-10-20 ENCOUNTER — Ambulatory Visit (INDEPENDENT_AMBULATORY_CARE_PROVIDER_SITE_OTHER): Payer: BC Managed Care – PPO | Admitting: Surgery

## 2011-10-20 VITALS — BP 126/80 | HR 88 | Temp 98.0°F | Resp 16 | Ht 61.5 in | Wt 188.8 lb

## 2011-10-20 DIAGNOSIS — Z9049 Acquired absence of other specified parts of digestive tract: Secondary | ICD-10-CM

## 2011-10-20 DIAGNOSIS — Z9889 Other specified postprocedural states: Secondary | ICD-10-CM

## 2011-10-20 NOTE — Progress Notes (Signed)
[name] is doing very well after her laparoscopic cholecystectomy. I think this took care of her problem. She works at [employer] appeared to be lumbar torque on Monday. His BMI was 35. Her incision is healed nicely 3 she is has one 10 mm in the upper midline which is the sorest of the 4.  I will be glad to see her again when needed.

## 2012-03-05 ENCOUNTER — Ambulatory Visit (INDEPENDENT_AMBULATORY_CARE_PROVIDER_SITE_OTHER): Payer: BC Managed Care – PPO | Admitting: Internal Medicine

## 2012-03-05 VITALS — BP 116/68 | HR 87 | Temp 98.2°F | Resp 16 | Ht 62.0 in | Wt 188.0 lb

## 2012-03-05 DIAGNOSIS — H6010 Cellulitis of external ear, unspecified ear: Secondary | ICD-10-CM

## 2012-03-05 DIAGNOSIS — H60399 Other infective otitis externa, unspecified ear: Secondary | ICD-10-CM

## 2012-03-05 MED ORDER — HYDROCODONE-ACETAMINOPHEN 5-500 MG PO TABS: 1.0000 | ORAL_TABLET | Freq: Four times a day (QID) | ORAL | Status: DC | PRN

## 2012-03-05 MED ORDER — CLINDAMYCIN HCL 300 MG PO CAPS: 300.0000 mg | ORAL_CAPSULE | Freq: Four times a day (QID) | ORAL | Status: DC

## 2012-03-05 NOTE — Progress Notes (Signed)
  Subjective:    Patient ID: [name], female    DOB: 21-Apr-1971, 41 y.o.   MRN: [id number]  HPI 41 yo AA F presents to clinic for acute onset ear pain.   On 02/29/12 Pt started to experience pain behind and below her R ear.  She also feels like her ear is swollen.  Since then the pain has spread to R eye and down the right side of her throat with swallowing.  She says that she can feel a mass under her skin that is tender to press on.  She denies having a fever.   Social history-Works on [address] at [employer] No past history of MRSA    Objective:   Physical Exam General: Pleasant, well dressed and cooperative 41 yo AA F. HEENT: Nontraumatic,, R ear external aspect of ear canal erythematous, tender, swollen  Tender mildly swollen anterior cervical node- right Canals clear/oropharynx clear  Procedure: Lanced w/ 18guage lower part of the auricle obtaining bloody pus for culture      Assessment & Plan:  Problem #1 cellulitis earlobe Heat ABX - clindamycin 10 days Pain medicine Culture Pt education Note for work tomorrow Meds ordered this encounter  Medications  . clindamycin (CLEOCIN) 300 MG capsule    Sig: Take 1 capsule (300 mg total) by mouth 4 (four) times daily.    Dispense:  40 capsule    Refill:  0  . HYDROcodone-acetaminophen (VICODIN) 5-500 MG per tablet    Sig: Take 1 tablet by mouth every 6 (six) hours as needed for pain.    Dispense:  12 tablet    Refill:  0

## 2012-03-06 ENCOUNTER — Telehealth: Payer: Self-pay

## 2012-03-06 NOTE — Telephone Encounter (Signed)
Was seen for Cellulitis of ear lobe, put on ABX. She is  c/o pain shooting into her temple area into her eye. The hydrocodone does not help the shooting pains, but makes her very drowsy.  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

## 2012-03-06 NOTE — Telephone Encounter (Signed)
Pt was informed by Dr. Merla Riches if she was feeling any better to contact the office, please contact pt and advise on further instructions. [phone]

## 2012-03-06 NOTE — Telephone Encounter (Signed)
Make sure she is not worse - if she is worse she needs to be rechecked.  She can take Motrin 800mg  tid in addition to the vicodin when it is ok for her to be drowsy and just the motrin when she is at work.  Make sure she is doing warm compresses.

## 2012-03-07 NOTE — Telephone Encounter (Signed)
Explained use of Motrin 800 mg along w/Vicodin when she is at home. Pt stated that she is continuing the warm compresses and feels that there is some improvement in the swelling in ear, but no improvement yet in shooting pain into her temple. Pt agreed to RTC if she doesn't cont to improve or worsens.

## 2012-03-08 LAB — WOUND CULTURE
Gram Stain: NONE SEEN
Gram Stain: NONE SEEN

## 2012-06-01 ENCOUNTER — Ambulatory Visit (INDEPENDENT_AMBULATORY_CARE_PROVIDER_SITE_OTHER): Payer: BC Managed Care – PPO | Admitting: Family Medicine

## 2012-06-01 VITALS — BP 132/84 | HR 92 | Temp 98.0°F | Resp 18 | Ht 62.5 in | Wt 188.4 lb

## 2012-06-01 DIAGNOSIS — D649 Anemia, unspecified: Secondary | ICD-10-CM

## 2012-06-01 DIAGNOSIS — H6980 Other specified disorders of Eustachian tube, unspecified ear: Secondary | ICD-10-CM

## 2012-06-01 DIAGNOSIS — J3489 Other specified disorders of nose and nasal sinuses: Secondary | ICD-10-CM

## 2012-06-01 DIAGNOSIS — R42 Dizziness and giddiness: Secondary | ICD-10-CM

## 2012-06-01 LAB — POCT CBC
Granulocyte percent: 61.6 %G (ref 37–80)
Hemoglobin: 8.5 g/dL — AB (ref 12.2–16.2)
POC Granulocyte: 4.3 (ref 2–6.9)
POC MID %: 6.2 %M (ref 0–12)
Platelet Count, POC: 471 10*3/uL — AB (ref 142–424)
RBC: 4.98 M/uL (ref 4.04–5.48)

## 2012-06-01 MED ORDER — CEFDINIR 300 MG PO CAPS: 300.0000 mg | ORAL_CAPSULE | Freq: Two times a day (BID) | ORAL | Status: DC

## 2012-06-01 NOTE — Patient Instructions (Addendum)
The dizziness was likely due in part to eustachian tube dysfunction.  Start saline nasal spray, afrin up to 3 days if needed for sinus pressure (or sudafed). If discolored nasal discharge, and not improving in 7-10 days - fill Omnicef. Your hemoglobin appears stable from prior, but is still low.  Take iron everyday. Return to the clinic or go to the nearest emergency room if any of your symptoms worsen or new symptoms occur.

## 2012-06-01 NOTE — Progress Notes (Signed)
Subjective:    Patient ID: [name], female    DOB: 29-Jan-1971, 41 y.o.   MRN: [id number]  HPI [name] is a 41 y.o. female Feels like having sinus infection.  Dizzy yesterday am - congestion in front of face - upper head and eyes and felt lightheaded and dizzy.  Vision blurry earlier, but not now.  No further dizziness, just pressure above eyes. Initially noted pressure 3 days ago.  No fever. No cough.  Slight chills. Ears ok, slight pnd.  No known sick contacts.  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   Hx of iron deficiency anemia - taking iron once per day about 4 out of 7 days.   Tx: otc sinus pressure pill.    Review of Systems  Constitutional: Positive for chills. Negative for fever.  HENT: Positive for sinus pressure.   Respiratory: Negative for chest tightness and shortness of breath.   Cardiovascular: Negative for chest pain.  Gastrointestinal: Negative for abdominal pain, blood in stool and anal bleeding.  Neurological: Positive for dizziness. Negative for seizures, syncope, facial asymmetry, speech difficulty, weakness and light-headedness.       Objective:   Physical Exam  Constitutional: She is oriented to person, place, and time. She appears well-developed and well-nourished. No distress.  HENT:  Head: Normocephalic and atraumatic.  Right Ear: Hearing, tympanic membrane, external ear and ear canal normal.  Left Ear: Hearing, tympanic membrane, external ear and ear canal normal.  Nose: Nose normal. Right sinus exhibits no maxillary sinus tenderness and no frontal sinus tenderness. Left sinus exhibits no maxillary sinus tenderness and no frontal sinus tenderness.  Mouth/Throat: Oropharynx is clear and moist. No oropharyngeal exudate.  Eyes: Conjunctivae normal and EOM are normal. Pupils are equal, round, and reactive to light.  Cardiovascular: Normal rate, regular rhythm, normal heart sounds and intact distal pulses.   No murmur  heard. Pulmonary/Chest: Effort normal and breath sounds normal. No respiratory distress. She has no wheezes. She has no rhonchi.  Abdominal: Normal appearance. There is no tenderness.  Neurological: She is alert and oriented to person, place, and time. She has normal strength. No sensory deficit. She displays a negative Romberg sign. Coordination and gait normal.       No pronator drift. nonfocal exam.   Skin: Skin is warm and dry. No rash noted.  Psychiatric: She has a normal mood and affect. Her behavior is normal.    Results for orders placed in visit on 06/01/12  POCT CBC      Component Value Range   WBC 7.0  4.6 - 10.2 K/uL   Lymph, poc 2.3  0.6 - 3.4   POC LYMPH PERCENT 32.2  10 - 50 %L   MID (cbc) 0.4  0 - 0.9   POC MID % 6.2  0 - 12 %M   POC Granulocyte 4.3  2 - 6.9   Granulocyte percent 61.6  37 - 80 %G   RBC 4.98  4.04 - 5.48 M/uL   Hemoglobin 8.5 (*) 12.2 - 16.2 g/dL   HCT, POC 95.6 (*) 21.3 - 47.9 %   MCV 58.2 (*) 80 - 97 fL   MCH, POC 17.1 (*) 27 - 31.2 pg   MCHC 29.3 (*) 31.8 - 35.4 g/dL   RDW, POC 08.6     Platelet Count, POC 471 (*) 142 - 424 K/uL   MPV    0 - 99.8 fL          Assessment &  Plan:  [name] is a 41 y.o. female 1. Dizziness  POCT CBC  2. ETD (eustachian tube dysfunction)    3. Anemia  POCT CBC  4. Sinus pressure  cefdinir (OMNICEF) 300 MG capsule    Likely ETD with underlying anemia - but stable HGB from prior.  Hx of nonadherence with iron - discussed need for daily dosing.  Trial of saline NS, afrin for ETD, omnicef if persistent sx's or more sinus infection sx's. rtc precautions.   Patient Instructions  The dizziness was likely due in part to eustachian tube dysfunction.  Start saline nasal spray, afrin up to 3 days if needed for sinus pressure (or sudafed). If discolored nasal discharge, and not improving in 7-10 days - fill Omnicef. Your hemoglobin appears stable from prior, but is still low.  Take iron everyday. Return to  the clinic or go to the nearest emergency room if any of your symptoms worsen or new symptoms occur.

## 2013-08-26 ENCOUNTER — Ambulatory Visit: Payer: BC Managed Care – PPO | Admitting: Family Medicine

## 2014-01-05 ENCOUNTER — Ambulatory Visit (HOSPITAL_COMMUNITY)
Admission: RE | Admit: 2014-01-05 | Discharge: 2014-01-05 | Disposition: A | Discharge status: Home / Self Care | Payer: BC Managed Care – PPO | Source: Ambulatory Visit | Attending: Family Medicine | Admitting: Family Medicine

## 2014-01-05 ENCOUNTER — Emergency Department (INDEPENDENT_AMBULATORY_CARE_PROVIDER_SITE_OTHER)
Admission: EM | Admit: 2014-01-05 | Discharge: 2014-01-05 | Disposition: A | Discharge status: Home / Self Care | Payer: BC Managed Care – PPO | Source: Home / Self Care | Attending: Family Medicine | Admitting: Family Medicine

## 2014-01-05 ENCOUNTER — Encounter (HOSPITAL_COMMUNITY): Payer: Self-pay | Admitting: Emergency Medicine

## 2014-01-05 DIAGNOSIS — M25569 Pain in unspecified knee: Secondary | ICD-10-CM | POA: Insufficient documentation

## 2014-01-05 DIAGNOSIS — M7989 Other specified soft tissue disorders: Secondary | ICD-10-CM

## 2014-01-05 DIAGNOSIS — M7650 Patellar tendinitis, unspecified knee: Secondary | ICD-10-CM

## 2014-01-05 DIAGNOSIS — M25561 Pain in right knee: Secondary | ICD-10-CM

## 2014-01-05 DIAGNOSIS — M7651 Patellar tendinitis, right knee: Secondary | ICD-10-CM

## 2014-01-05 MED ORDER — INDOMETHACIN 25 MG PO CAPS: 25.0000 mg | ORAL_CAPSULE | Freq: Three times a day (TID) | ORAL | Status: DC | PRN

## 2014-01-05 NOTE — Discharge Instructions (Signed)
The ultrasound of your right leg was negative for blood clot. Ice and elevation with ACE wrap as needed for comfort and to reduce swelling Tendinitis Tendinitis is swelling and inflammation of the tendons. Tendons are band-like tissues that connect muscle to bone. Tendinitis commonly occurs in the:   Shoulders (rotator cuff).  Heels (Achilles tendon).  Elbows (triceps tendon). CAUSES Tendinitis is usually caused by overusing the tendon, muscles, and joints involved. When the tissue surrounding a tendon (synovium) becomes inflamed, it is called tenosynovitis. Tendinitis commonly develops in people whose jobs require repetitive motions. SYMPTOMS  Pain.  Tenderness.  Mild swelling. DIAGNOSIS Tendinitis is usually diagnosed by physical exam. Your health care provider may also order X-rays or other imaging tests. TREATMENT Your health care provider may recommend certain medicines or exercises for your treatment. HOME CARE INSTRUCTIONS   Use a sling or splint for as long as directed by your health care provider until the pain decreases.  Put ice on the injured area.  Put ice in a plastic bag.  Place a towel between your skin and the bag.  Leave the ice on for 15-20 minutes, 3-4 times a day, or as directed by your health care provider.  Avoid using the limb while the tendon is painful. Perform gentle range of motion exercises only as directed by your health care provider. Stop exercises if pain or discomfort increase, unless directed otherwise by your health care provider.  Only take over-the-counter or prescription medicines for pain, discomfort, or fever as directed by your health care provider. SEEK MEDICAL CARE IF:   Your pain and swelling increase.  You develop new, unexplained symptoms, especially increased numbness in the hands. MAKE SURE YOU:   Understand these instructions.  Will watch your condition.  Will get help right away if you are not doing well or get  worse. Document Released: 05/20/2000 Document Revised: 10/07/2013 Document Reviewed: 08/09/2010 [facility] Patient Information 2015 [place], [place]. This information is not intended to replace advice given to you by your health care provider. Make sure you discuss any questions you have with your health care provider.

## 2014-01-05 NOTE — ED Notes (Signed)
To meet [name] for venous duplex. [name] is in ED waiting, and they will call us when completed to provide return trip to [facility] for report consult

## 2014-01-05 NOTE — Progress Notes (Signed)
VASCULAR LAB PRELIMINARY  PRELIMINARY  PRELIMINARY  PRELIMINARY  Right lower extremity venous Doppler completed.    Preliminary report:  There is no DVT or SVT noted in the right lower extremity.    Boluwatife Flight, RVT 01/05/2014, 11:31 AM

## 2014-01-05 NOTE — ED Notes (Signed)
Return from dopler study , stable

## 2014-01-05 NOTE — ED Notes (Signed)
C/o pain and swelling right lower leg; concerned about poss DVT, as she works 12 hour shifts

## 2014-01-05 NOTE — ED Provider Notes (Signed)
CSN: [id number]     Arrival date & time 01/05/14  8938 History   First MD Initiated Contact with Patient 01/05/14 1041     Chief Complaint  Patient presents with  . Leg Pain   (Consider location/radiation/quality/duration/timing/severity/associated sxs/prior Treatment) HPI Comments: Patient states she works at [employer] at [employer] and typically works 12 hours shifts. She has noticed some tenderness and swelling at her right knee that extends down her lower leg over the past eight days. No known injury. Limited relief with elevation and ibuprofen at home. Has also tried wearing compression stocking with little relief. Endorses history of RLE DVT in 2009. Was treated with Warfarin x 3 months.  Began recently (one week ago) taking oral estrogen replacement therapy prescribed by [facility] provider.  Denies fever, dyspnea or chest pain. No hx of PE.   The history is provided by the patient.    Past Medical History  Diagnosis Date  . Heartburn   . Epigastric pain   . Gallstones   . Iron deficiency anemia   . Asthma     IN PAST NO RECENT PROBLEMS  . Headache(784.0)   . Blood clot in vein 2008    right leg   Past Surgical History  Procedure Laterality Date  . Tubal reversal    . Cholecystectomy  10/05/2011    Procedure: LAPAROSCOPIC CHOLECYSTECTOMY WITH INTRAOPERATIVE CHOLANGIOGRAM;  Surgeon: Pedro Earls, MD;  Location: [facility];  Service: General;  Laterality: N/A;   Family History  Problem Relation Age of Onset  . Diabetes      Family History  . Colon cancer Neg Hx   . Hypertension Mother   . Diabetes Mother    History  Substance Use Topics  . Smoking status: Never Smoker   . Smokeless tobacco: Never Used  . Alcohol Use: Yes     Comment: RARE   OB History   Grav Para Term Preterm Abortions TAB SAB Ect Mult Living                 Review of Systems  All other systems reviewed and are negative.   Allergies  Review of patient's allergies indicates no known  allergies.  Home Medications   Prior to Admission medications   Medication Sig Start Date End Date Taking? Authorizing Provider  cefdinir (OMNICEF) 300 MG capsule Take 1 capsule (300 mg total) by mouth 2 (two) times daily. 06/01/12   Wendie Agreste, MD  clindamycin (CLEOCIN) 300 MG capsule Take 1 capsule (300 mg total) by mouth 4 (four) times daily. 03/05/12   Leandrew Koyanagi, MD  HYDROcodone-acetaminophen (VICODIN) 5-500 MG per tablet Take 1 tablet by mouth every 6 (six) hours as needed for pain. 03/05/12   Leandrew Koyanagi, MD  indomethacin (INDOCIN) 25 MG capsule Take 1 capsule (25 mg total) by mouth 3 (three) times daily as needed for mild pain or moderate pain (and to reduce swelling). 01/05/14   Annett Gula Vielka Klinedinst, PA   BP 151/85  Pulse 82  Temp(Src) 98.4 F (36.9 C) (Oral)  Resp 16  SpO2 99% Physical Exam  Nursing note and vitals reviewed. Constitutional: She is oriented to person, place, and time. She appears well-developed and well-nourished. No distress.  HENT:  Head: Normocephalic and atraumatic.  Eyes: Conjunctivae are normal. No scleral icterus.  Cardiovascular: Normal rate, regular rhythm and normal heart sounds.   Pulmonary/Chest: Effort normal and breath sounds normal.  Musculoskeletal:       Right knee:  She exhibits swelling. She exhibits normal range of motion, no effusion, no ecchymosis, no deformity, no laceration, no erythema, normal alignment, no bony tenderness, normal meniscus and no MCL laxity. Tenderness found. No medial joint line, no lateral joint line, no MCL, no LCL and no patellar tendon tenderness noted.       Legs: Outlined area is area of discomfort with palpation and ROM. Reports discomfort radiates to right calf Patellar mechanism is intact.  No joint effusion  Neurological: She is alert and oriented to person, place, and time.  Skin: Skin is warm and dry. No rash noted. No erythema.  Psychiatric: She has a normal mood and affect. Her behavior  is normal.    ED Course  Procedures (including critical care time) Labs Review Labs Reviewed - No data to display  Imaging Review No results found.   MDM   1. Patellar tendonitis of right knee   Patient transported to [facility]  For RLE duplex U/S and then brought back to [facility] via shuttle. RLE U/S reported as negative for DVT. Exam suggestive of patellar tendonitis. Will encourage continued ice and elevation to reduce swelling and indocin as prescribed for pain reduction and to reduce inflammation. Advised follow up if no improvement over next 1-2 weeks.    [place], [place] 01/05/14 1352

## 2014-01-05 NOTE — ED Provider Notes (Signed)
Medical screening examination/treatment/procedure(s) were performed by resident physician or non-physician practitioner and as supervising physician I was immediately available for consultation/collaboration.   Pauline Good MD.   Billy Fischer, MD 01/05/14 [phone]

## 2014-01-06 ENCOUNTER — Other Ambulatory Visit (HOSPITAL_COMMUNITY): Payer: Self-pay | Admitting: Family Medicine

## 2014-01-06 DIAGNOSIS — M25561 Pain in right knee: Secondary | ICD-10-CM

## 2014-03-07 ENCOUNTER — Encounter: Payer: Self-pay | Admitting: Gastroenterology

## 2014-03-22 ENCOUNTER — Emergency Department (HOSPITAL_COMMUNITY)
Admission: EM | Admit: 2014-03-22 | Discharge: 2014-03-22 | Disposition: A | Discharge status: Home / Self Care | Payer: BC Managed Care – PPO | Attending: Emergency Medicine | Admitting: Emergency Medicine

## 2014-03-22 ENCOUNTER — Encounter (HOSPITAL_COMMUNITY): Payer: Self-pay | Admitting: Emergency Medicine

## 2014-03-22 DIAGNOSIS — Y9289 Other specified places as the place of occurrence of the external cause: Secondary | ICD-10-CM | POA: Diagnosis not present

## 2014-03-22 DIAGNOSIS — D509 Iron deficiency anemia, unspecified: Secondary | ICD-10-CM | POA: Diagnosis not present

## 2014-03-22 DIAGNOSIS — J45909 Unspecified asthma, uncomplicated: Secondary | ICD-10-CM | POA: Diagnosis not present

## 2014-03-22 DIAGNOSIS — X58XXXA Exposure to other specified factors, initial encounter: Secondary | ICD-10-CM | POA: Insufficient documentation

## 2014-03-22 DIAGNOSIS — Z7901 Long term (current) use of anticoagulants: Secondary | ICD-10-CM | POA: Insufficient documentation

## 2014-03-22 DIAGNOSIS — Z3202 Encounter for pregnancy test, result negative: Secondary | ICD-10-CM | POA: Diagnosis not present

## 2014-03-22 DIAGNOSIS — Z86718 Personal history of other venous thrombosis and embolism: Secondary | ICD-10-CM | POA: Diagnosis not present

## 2014-03-22 DIAGNOSIS — Z79899 Other long term (current) drug therapy: Secondary | ICD-10-CM | POA: Diagnosis not present

## 2014-03-22 DIAGNOSIS — S86911A Strain of unspecified muscle(s) and tendon(s) at lower leg level, right leg, initial encounter: Secondary | ICD-10-CM | POA: Insufficient documentation

## 2014-03-22 DIAGNOSIS — S8991XA Unspecified injury of right lower leg, initial encounter: Secondary | ICD-10-CM | POA: Diagnosis present

## 2014-03-22 DIAGNOSIS — Y9301 Activity, walking, marching and hiking: Secondary | ICD-10-CM | POA: Insufficient documentation

## 2014-03-22 DIAGNOSIS — R51 Headache: Secondary | ICD-10-CM | POA: Insufficient documentation

## 2014-03-22 DIAGNOSIS — Z8719 Personal history of other diseases of the digestive system: Secondary | ICD-10-CM | POA: Diagnosis not present

## 2014-03-22 LAB — PREGNANCY, URINE: Preg Test, Ur: NEGATIVE

## 2014-03-22 MED ORDER — IBUPROFEN 800 MG PO TABS: 800.0000 mg | ORAL_TABLET | Freq: Three times a day (TID) | ORAL | Status: DC | PRN

## 2014-03-22 MED ORDER — IBUPROFEN 800 MG PO TABS: 800.0000 mg | ORAL_TABLET | Freq: Once | ORAL | Status: DC

## 2014-03-22 MED ORDER — IBUPROFEN 800 MG PO TABS
800.0000 mg | ORAL_TABLET | Freq: Once | ORAL | Status: AC
  Administered 2014-03-22: 800 mg via ORAL
  Filled 2014-03-22: qty 4

## 2014-03-22 MED ORDER — OXYCODONE-ACETAMINOPHEN 5-325 MG PO TABS: 2.0000 | ORAL_TABLET | Freq: Four times a day (QID) | ORAL | Status: DC | PRN

## 2014-03-22 NOTE — ED Provider Notes (Signed)
CSN: [id number]     Arrival date & time 03/22/14  1023 History   First MD Initiated Contact with Patient 03/22/14 1056     Chief Complaint  Patient presents with  . Leg Pain    spasm in back of r/thigh since last night     (Consider location/radiation/quality/duration/timing/severity/associated sxs/prior Treatment) The history is provided by the patient.  [name] is a 43 y.o. female hx of gallstones, anemia, R leg DVT not on anticoagulation here with R thigh pain. She was walking around yesterday and suddenly felt a sharp pain in the right thigh area. She felt that her right posterior thigh is more swollen and tender. She has pain with ambulation. Denies leg swelling or calf tenderness. Had neg DVT study 2 months ago. States that this is different than previous DVT. Denies recent travels or shortness of breath or chest pain.    Past Medical History  Diagnosis Date  . Heartburn   . Epigastric pain   . Gallstones   . Iron deficiency anemia   . Asthma     IN PAST NO RECENT PROBLEMS  . Headache(784.0)   . Blood clot in vein 2008    right leg   Past Surgical History  Procedure Laterality Date  . Tubal reversal    . Cholecystectomy  10/05/2011    Procedure: LAPAROSCOPIC CHOLECYSTECTOMY WITH INTRAOPERATIVE CHOLANGIOGRAM;  Surgeon: Pedro Earls, MD;  Location: [facility];  Service: General;  Laterality: N/A;  . Tubal ligation     Family History  Problem Relation Age of Onset  . Diabetes      Family History  . Colon cancer Neg Hx   . Hypertension Mother   . Diabetes Mother   . Cancer Father    History  Substance Use Topics  . Smoking status: Never Smoker   . Smokeless tobacco: Never Used  . Alcohol Use: Yes     Comment: RARE   OB History   Grav Para Term Preterm Abortions TAB SAB Ect Mult Living                 Review of Systems  Musculoskeletal:       R thigh pain   All other systems reviewed and are negative.     Allergies  Review of patient's  allergies indicates no known allergies.  Home Medications   Prior to Admission medications   Medication Sig Start Date End Date Taking? Authorizing Provider  ferrous sulfate 325 (65 FE) MG tablet Take 325 mg by mouth daily with breakfast.   Yes Historical Provider, MD  ibuprofen (ADVIL,MOTRIN) 200 MG tablet Take 400 mg by mouth every 6 (six) hours as needed for moderate pain.   Yes Historical Provider, MD  influenza vac recombinant HA trivalent (FLUBLOK) injection Inject 0.5 mLs into the muscle once.   Yes Historical Provider, MD   BP 130/76  Pulse 83  Temp(Src) 98.3 F (36.8 C) (Oral)  Resp 20  Wt 194 lb (87.998 kg)  SpO2 100%  LMP 03/01/2014 Physical Exam  Nursing note and vitals reviewed. Constitutional: She is oriented to person, place, and time.  Uncomfortable   HENT:  Head: Normocephalic.  Eyes: Pupils are equal, round, and reactive to light.  Neck: Normal range of motion.  Cardiovascular: Normal rate.   Pulmonary/Chest: Effort normal and breath sounds normal. No respiratory distress. She has no wheezes. She has no rales.  Abdominal: Soft. Bowel sounds are normal. She exhibits no distension. There is no tenderness.  There is no rebound.  Musculoskeletal:  No midline spinal tenderness, nl ROM R hip. Focally tender on the superior aspect of the hamstring R leg. Able to keep leg off the bed but has pain with it. No calf tenderness. 2+ pulses. Nl plantar flexion. Nl knee flexion and extension. No obvious bony tenderness   Neurological: She is alert and oriented to person, place, and time.  Skin: Skin is warm and dry.  Psychiatric: She has a normal mood and affect. Her behavior is normal. Judgment and thought content normal.    ED Course  Procedures (including critical care time) Labs Review Labs Reviewed  PREGNANCY, URINE    Imaging Review No results found.   EKG Interpretation None      MDM   Final diagnoses:  None   [name] is a 43 y.o. female here  with R thigh pain. I think likely muscle strain vs torn ligament. I doubt DVT, no signs of compartment syndrome or fracture. Will place in knee immobilizer and give crutches. Will d/c home with pain meds and ortho f/u.     Wandra Arthurs, MD 03/22/14 [phone]

## 2014-03-22 NOTE — ED Notes (Addendum)
Pt in c/o sharp pain in muscle in back of r/thigh radiating to mid calf.. Pt reports increased pain while sitting or ambulating. Pain was sudden onset  while running last night

## 2014-03-22 NOTE — Discharge Instructions (Signed)
Take motrin 800 mg every 6 hrs for pain.   Take percocet for severe pain. Do not drive with it.   Follow up with orthopedic doctor in 1 week if you are not better.   Use knee immobilizer and crutches.   Return to ER if you have severe pain, unable to walk.

## 2014-10-10 ENCOUNTER — Encounter: Payer: Self-pay | Admitting: Gastroenterology

## 2014-10-13 ENCOUNTER — Encounter: Payer: Self-pay | Admitting: Gastroenterology

## 2015-03-09 ENCOUNTER — Ambulatory Visit (INDEPENDENT_AMBULATORY_CARE_PROVIDER_SITE_OTHER): Payer: BLUE CROSS/BLUE SHIELD | Admitting: Family Medicine

## 2015-03-09 VITALS — BP 140/96 | HR 82 | Temp 98.1°F | Resp 16 | Ht 62.5 in | Wt 200.0 lb

## 2015-03-09 DIAGNOSIS — R635 Abnormal weight gain: Secondary | ICD-10-CM | POA: Diagnosis not present

## 2015-03-09 DIAGNOSIS — H109 Unspecified conjunctivitis: Secondary | ICD-10-CM

## 2015-03-09 DIAGNOSIS — R519 Headache, unspecified: Secondary | ICD-10-CM

## 2015-03-09 DIAGNOSIS — R51 Headache: Secondary | ICD-10-CM

## 2015-03-09 DIAGNOSIS — R030 Elevated blood-pressure reading, without diagnosis of hypertension: Secondary | ICD-10-CM

## 2015-03-09 DIAGNOSIS — IMO0001 Reserved for inherently not codable concepts without codable children: Secondary | ICD-10-CM

## 2015-03-09 MED ORDER — OFLOXACIN 0.3 % OP SOLN: 2.0000 [drp] | Freq: Four times a day (QID) | OPHTHALMIC | Status: DC

## 2015-03-09 MED ORDER — METHOCARBAMOL 750 MG PO TABS: 750.0000 mg | ORAL_TABLET | Freq: Three times a day (TID) | ORAL | Status: DC | PRN

## 2015-03-09 NOTE — Patient Instructions (Addendum)
Will get flu shot at work  Take the eye drops 1- 2 drops each eye 4 times daily for 4-5 days  Stay off work through Wednesday  Return at anytime if worse  Monitor your blood pressure. If it remains elevated greater than 140/90 please return.  Work on regular exercise and eating less in order to try and lose some weight  Advise getting a complete physical sometime soon  Take ibuprofen 600 mg 3 times daily as needed for the headaches  Take the Robaxin (methocarbamol) 750 mg one pill primarily at bedtime, but can take 3 times daily if not working. May make your little bit drowsy. Return if headaches are not improving.

## 2015-03-09 NOTE — Progress Notes (Signed)
Patient ID: [name], female    DOB: 09-Jan-1971  Age: 44 y.o. MRN: [id number]  Chief Complaint  Patient presents with  . Eye Drainage    Pinkness also onset 4 days  . Headache    Onset 1 month off and on    Subjective:   44 year old lady who came in complaining of her eyes being irritated. 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 Dr. looked at it Saturday and again yesterday. He recommended she come in and get checked. She has had some redness and irritation that started in the right he, then went to the left. They have it's a burn some. There is crusting of the eyelids this morning. Has not been sick otherwise with this.  Her past month she had a headache off and on, specialist weeks fairly persistently behind the right eye. No nausea vomiting or any neurologic side effects. It has not really gotten worse, persistent hurting. She is taking some ibuprofen for it. She is only taking this when necessary. Does not have a headache history.  She does not have a history of high blood pressure.  Current allergies, medications, problem list, past/family and social histories reviewed.  Objective:  BP 140/96 mmHg  Pulse 82  Temp(Src) 98.1 F (36.7 C) (Oral)  Resp 16  Ht 5' 2.5" (1.588 m)  Wt 200 lb (90.719 kg)  BMI 35.97 kg/m2  SpO2 99%  LMP 03/06/2015  Pleasant lady in no acute distress. TMs normal. Eyes PERRLA. Fundi benign. Eyes mildly injected, right slightly worse than left. No discharge at this time. Chest clear. Heart regular without murmurs. No carotid bruits.  Assessment & Plan:   Assessment: 1. Bilateral conjunctivitis   2. Nonintractable headache, unspecified chronicity pattern, unspecified headache type   3. Blood pressure elevated   4. Weight gain       Plan: Treat symptomatically for the headaches. If they continue to persist will require further diagnostic workup. Return if not improving after taking some regular insulin treatment. Monitor her  blood pressure. Antibiotic eyedrops, return if worse.   Meds ordered this encounter  Medications  . ofloxacin (OCUFLOX) 0.3 % ophthalmic solution    Sig: Place 2 drops into both eyes 4 (four) times daily.    Dispense:  5 mL    Refill:  0  . methocarbamol (ROBAXIN-750) 750 MG tablet    Sig: Take 1 tablet (750 mg total) by mouth every 8 (eight) hours as needed for muscle spasms.    Dispense:  30 tablet    Refill:  0         Patient Instructions  Will get flu shot at work  Take the eye drops 1- 2 drops each eye 4 times daily for 4-5 days  Stay off work through Wednesday  Return at anytime if worse  Monitor your blood pressure. If it remains elevated greater than 140/90 please return.  Work on regular exercise and eating less in order to try and lose some weight  Advise getting a complete physical sometime soon  Take ibuprofen 600 mg 3 times daily as needed for the headaches  Take the Robaxin (methocarbamol) 750 mg one pill primarily at bedtime, but can take 3 times daily if not working. May make your little bit drowsy. Return if headaches are not improving.    Return if symptoms worsen or fail to improve.   HOPPER,DAVID, MD 03/09/2015

## 2015-03-18 ENCOUNTER — Ambulatory Visit (INDEPENDENT_AMBULATORY_CARE_PROVIDER_SITE_OTHER): Payer: BLUE CROSS/BLUE SHIELD | Admitting: Physician Assistant

## 2015-03-18 VITALS — BP 182/102 | HR 88 | Temp 98.6°F | Resp 18 | Ht 62.5 in | Wt 198.0 lb

## 2015-03-18 DIAGNOSIS — R9431 Abnormal electrocardiogram [ECG] [EKG]: Secondary | ICD-10-CM

## 2015-03-18 DIAGNOSIS — I10 Essential (primary) hypertension: Secondary | ICD-10-CM | POA: Diagnosis not present

## 2015-03-18 LAB — COMPLETE METABOLIC PANEL WITH GFR
ALT: 14 U/L (ref 6–29)
AST: 20 U/L (ref 10–30)
Albumin: 4.4 g/dL (ref 3.6–5.1)
Alkaline Phosphatase: 51 U/L (ref 33–115)
BUN: 10 mg/dL (ref 7–25)
CO2: 25 mmol/L (ref 20–31)
Calcium: 9.7 mg/dL (ref 8.6–10.2)
Chloride: 101 mmol/L (ref 98–110)
Creat: 0.65 mg/dL (ref 0.50–1.10)
GFR, Est African American: >89 mL/min (ref >=60)
Glucose, Bld: 102 mg/dL — ABNORMAL HIGH (ref 65–99)
POTASSIUM: 4.4 mmol/L (ref 3.5–5.3)
SODIUM: 137 mmol/L (ref 135–146)
Total Bilirubin: 0.6 mg/dL (ref 0.2–1.2)
Total Protein: 7.8 g/dL (ref 6.1–8.1)

## 2015-03-18 LAB — POCT CBC
Granulocyte percent: 64 %G (ref 37–80)
HCT, POC: 31.8 % — AB (ref 37.7–47.9)
HEMOGLOBIN: 9.9 g/dL — AB (ref 12.2–16.2)
LYMPH, POC: 2 (ref 0.6–3.4)
MCH, POC: 21 pg — AB (ref 27–31.2)
MCHC: 31.2 g/dL — AB (ref 31.8–35.4)
MCV: 67.2 fL — AB (ref 80–97)
MID (CBC): 0.4 (ref 0–0.9)
MPV: 7.3 fL (ref 0–99.8)
POC Granulocyte: 4.2 (ref 2–6.9)
POC LYMPH PERCENT: 30.3 %L (ref 10–50)
POC MID %: 5.7 %M (ref 0–12)
Platelet Count, POC: 374 10*3/uL (ref 142–424)
RBC: 4.73 M/uL (ref 4.04–5.48)
RDW, POC: 17.7 %
WBC: 6.5 10*3/uL (ref 4.6–10.2)

## 2015-03-18 LAB — LIPID PANEL
CHOL/HDL RATIO: 3.2 ratio (ref <=5.0)
Cholesterol: 185 mg/dL (ref 125–200)
HDL: 58 mg/dL (ref >=46)
LDL CALC: 107 mg/dL (ref <130)
Triglycerides: 102 mg/dL (ref <150)
VLDL: 20 mg/dL (ref <30)

## 2015-03-18 MED ORDER — AMLODIPINE BESYLATE 5 MG PO TABS: 5.0000 mg | ORAL_TABLET | Freq: Every day | ORAL | Status: DC

## 2015-03-18 MED ORDER — HYDROCHLOROTHIAZIDE 12.5 MG PO CAPS: 12.5000 mg | ORAL_CAPSULE | Freq: Every day | ORAL | Status: DC

## 2015-03-18 NOTE — Patient Instructions (Signed)
I have placed a referral for appointment with Dr. Einar Gip, so please await phone call for appointment set up. You will take both medications as prescribed.  Check your blood pressure daily, and you can record it.  I would like to see you in 1 week where you are able to make an appointment. I will contact you with the results of your blood work. DASH Eating Plan DASH stands for "Dietary Approaches to Stop Hypertension." The DASH eating plan is a healthy eating plan that has been shown to reduce high blood pressure (hypertension). Additional health benefits may include reducing the risk of type 2 diabetes mellitus, heart disease, and stroke. The DASH eating plan may also help with weight loss. WHAT DO I NEED TO KNOW ABOUT THE DASH EATING PLAN? For the DASH eating plan, you will follow these general guidelines:  Choose foods with a percent daily value for sodium of less than 5% (as listed on the food label).  Use salt-free seasonings or herbs instead of table salt or sea salt.  Check with your health care provider or pharmacist before using salt substitutes.  Eat lower-sodium products, often labeled as "lower sodium" or "no salt added."  Eat fresh foods.  Eat more vegetables, fruits, and low-fat dairy products.  Choose whole grains. Look for the word "whole" as the first word in the ingredient list.  Choose fish and skinless chicken or [place] more often than red meat. Limit fish, poultry, and meat to 6 oz (170 g) each day.  Limit sweets, desserts, sugars, and sugary drinks.  Choose heart-healthy fats.  Limit cheese to 1 oz (28 g) per day.  Eat more home-cooked food and less restaurant, buffet, and fast food.  Limit fried foods.  Cook foods using methods other than frying.  Limit canned vegetables. If you do use them, rinse them well to decrease the sodium.  When eating at a restaurant, ask that your food be prepared with less salt, or no salt if possible. WHAT FOODS CAN I EAT? Seek  help from a dietitian for individual calorie needs. Grains Whole grain or whole wheat bread. Brown rice. Whole grain or whole wheat pasta. Quinoa, bulgur, and whole grain cereals. Low-sodium cereals. Corn or whole wheat flour tortillas. Whole grain cornbread. Whole grain crackers. Low-sodium crackers. Vegetables Fresh or frozen vegetables (raw, steamed, roasted, or grilled). Low-sodium or reduced-sodium tomato and vegetable juices. Low-sodium or reduced-sodium tomato sauce and paste. Low-sodium or reduced-sodium canned vegetables.  Fruits All fresh, canned (in natural juice), or frozen fruits. Meat and Other Protein Products Ground beef (85% or leaner), grass-fed beef, or beef trimmed of fat. Skinless chicken or [place]. Ground chicken or [place]. Pork trimmed of fat. All fish and seafood. Eggs. Dried beans, peas, or lentils. Unsalted nuts and seeds. Unsalted canned beans. Dairy Low-fat dairy products, such as skim or 1% milk, 2% or reduced-fat cheeses, low-fat ricotta or cottage cheese, or plain low-fat yogurt. Low-sodium or reduced-sodium cheeses. Fats and Oils Tub margarines without trans fats. Light or reduced-fat mayonnaise and salad dressings (reduced sodium). Avocado. Safflower, olive, or canola oils. Natural peanut or almond butter. Other Unsalted popcorn and pretzels. The items listed above may not be a complete list of recommended foods or beverages. Contact your dietitian for more options. WHAT FOODS ARE NOT RECOMMENDED? Grains White bread. White pasta. White rice. Refined cornbread. Bagels and croissants. Crackers that contain trans fat. Vegetables Creamed or fried vegetables. Vegetables in a cheese sauce. Regular canned vegetables. Regular canned tomato sauce and  paste. Regular tomato and vegetable juices. Fruits Dried fruits. Canned fruit in light or heavy syrup. Fruit juice. Meat and Other Protein Products Fatty cuts of meat. Ribs, chicken wings, bacon, sausage, bologna, salami,  chitterlings, fatback, hot dogs, bratwurst, and packaged luncheon meats. Salted nuts and seeds. Canned beans with salt. Dairy Whole or 2% milk, cream, half-and-half, and cream cheese. Whole-fat or sweetened yogurt. Full-fat cheeses or blue cheese. Nondairy creamers and whipped toppings. Processed cheese, cheese spreads, or cheese curds. Condiments Onion and garlic salt, seasoned salt, table salt, and sea salt. Canned and packaged gravies. Worcestershire sauce. Tartar sauce. Barbecue sauce. Teriyaki sauce. Soy sauce, including reduced sodium. Steak sauce. Fish sauce. Oyster sauce. Cocktail sauce. Horseradish. Ketchup and mustard. Meat flavorings and tenderizers. Bouillon cubes. Hot sauce. Tabasco sauce. Marinades. Taco seasonings. Relishes. Fats and Oils Butter, stick margarine, lard, shortening, ghee, and bacon fat. Coconut, palm kernel, or palm oils. Regular salad dressings. Other Pickles and olives. Salted popcorn and pretzels. The items listed above may not be a complete list of foods and beverages to avoid. Contact your dietitian for more information. WHERE CAN I FIND MORE INFORMATION? National Heart, Lung, and Blood Institute: travelstabloid.com   This information is not intended to replace advice given to you by your health care provider. Make sure you discuss any questions you have with your health care provider.   Document Released: 05/12/2011 Document Revised: 06/13/2014 Document Reviewed: 03/27/2013 Elsevier Interactive Patient Education [employer].

## 2015-03-18 NOTE — Progress Notes (Signed)
[facility] [address] [place] [address] [phone]  Date:  03/18/2015   Name:  [name]   DOB:  07/20/1970   MRN:  [id number]  PCP:  No PCP Per Patient    History of Present Illness:  [name] is a 44 y.o. female patient who presents to [facility] for elevated blood pressure.    Patient states that her blood pressure hs been checked for the last 2 weeks over 170/90s.  She works in [employer] as a Marine scientist.  She has no chest pains, palpitations, sob, leg swelling, or dyspnea.  She also has developed several episodes of migraines--exhibiting photophobia and phonophobia, nausea, and vomiting.  She has auras.  Head pain is at the front of head, and bandlike.  She has no dizziness or change in visual acuity.  She has tried tylenol, aleve, and zofran which helps very little.    No upper respiratory illness.   Very little alcohol use.    Patient Active Problem List   Diagnosis Date Noted  . Chronic cholecystitis 09/08/2011  . IRON DEFICIENCY 10/22/2009  . ABDOMINAL PAIN, LEFT UPPER QUADRANT 07/22/2009  . ESOPHAGEAL REFLUX 04/17/2009    Past Medical History  Diagnosis Date  . Heartburn   . Epigastric pain   . Gallstones   . Iron deficiency anemia   . Asthma     IN PAST NO RECENT PROBLEMS  . Headache(784.0)   . Blood clot in vein 2008    right leg    Past Surgical History  Procedure Laterality Date  . Tubal reversal    . Cholecystectomy  10/05/2011    Procedure: LAPAROSCOPIC CHOLECYSTECTOMY WITH INTRAOPERATIVE CHOLANGIOGRAM;  Surgeon: Pedro Earls, MD;  Location: [facility];  Service: General;  Laterality: N/A;  . Tubal ligation      Social History  Substance Use Topics  . Smoking status: Never Smoker   . Smokeless tobacco: Never Used  . Alcohol Use: Yes     Comment: RARE    Family History  Problem Relation Age of Onset  . Diabetes      Family History  . Colon cancer Neg Hx   . Hypertension Mother   . Diabetes Mother   .  Cancer Father     No Known Allergies  Medication list has been reviewed and updated.  Current Outpatient Prescriptions on File Prior to Visit  Medication Sig Dispense Refill  . ferrous sulfate 325 (65 FE) MG tablet Take 325 mg by mouth daily with breakfast.    . ibuprofen (ADVIL,MOTRIN) 800 MG tablet Take 1 tablet (800 mg total) by mouth every 8 (eight) hours as needed for mild pain. 30 tablet 0  . methocarbamol (ROBAXIN-750) 750 MG tablet Take 1 tablet (750 mg total) by mouth every 8 (eight) hours as needed for muscle spasms. 30 tablet 0  . oxyCODONE-acetaminophen (PERCOCET) 5-325 MG per tablet Take 2 tablets by mouth every 6 (six) hours as needed. 15 tablet 0  . influenza vac recombinant HA trivalent (FLUBLOK) injection Inject 0.5 mLs into the muscle once.    Marland Kitchen ofloxacin (OCUFLOX) 0.3 % ophthalmic solution Place 2 drops into both eyes 4 (four) times daily. (Patient not taking: Reported on 03/18/2015) 5 mL 0   No current facility-administered medications on file prior to visit.    Review of Systems  Constitutional: Negative for fever.  Respiratory: Negative for shortness of breath.   Cardiovascular: Negative for chest pain, palpitations and leg swelling.  Gastrointestinal: Positive for nausea.  Musculoskeletal: Negative for neck pain.  Neurological: Negative for dizziness, tingling, tremors and focal weakness.      Physical Examination: BP 182/102 mmHg  Pulse 88  Temp(Src) 98.6 F (37 C) (Oral)  Resp 18  Ht 5' 2.5" (1.588 m)  Wt 198 lb (89.812 kg)  BMI 35.62 kg/m2  SpO2 99%  LMP 03/06/2015 Ideal Body Weight: Weight in (lb) to have BMI = 25: 138.6  Physical Exam  Constitutional: She is oriented to person, place, and time. She appears well-developed and well-nourished. No distress.  HENT:  Head: Normocephalic and atraumatic.  Right Ear: External ear normal.  Left Ear: External ear normal.  Eyes: Conjunctivae and EOM are normal. Pupils are equal, round, and reactive to  light.  Cardiovascular: Normal rate, regular rhythm, normal heart sounds and intact distal pulses.  Exam reveals no friction rub.   No murmur heard. Pulses:      Carotid pulses are 2+ on the right side, and 2+ on the left side.      Radial pulses are 2+ on the right side, and 2+ on the left side.       Dorsalis pedis pulses are 2+ on the right side, and 2+ on the left side.  Pulmonary/Chest: Effort normal. No respiratory distress.  Musculoskeletal: She exhibits no edema.  Neurological: She is alert and oriented to person, place, and time.  Skin: She is not diaphoretic.  Psychiatric: She has a normal mood and affect. Her behavior is normal.   EKG intepreted by Dr. Everlene Farrier: T wave inversion at the III, aVF, and V1, V2  Assessment and Plan: [name] is a 44 y.o. female who is here today for elevated blood pressure. -Cardiology consult appreciated at this time, though she has no cardiovascular symptoms.  T wave changes should be explored.   -Will start her on amlodipine and low dose diuretic at this time.  CCB likely will help with head pain as well.  -She will track bp daily, and return in 1 week for follow up.  Essential hypertension - Plan: EKG 12-Lead, COMPLETE METABOLIC PANEL WITH GFR, Lipid panel, POCT CBC, amLODipine (NORVASC) 5 MG tablet, hydrochlorothiazide (MICROZIDE) 12.5 MG capsule, Ambulatory referral to Cardiology  Abnormal EKG - Plan: Ambulatory referral to Cardiology  Ivar Drape, PA-C [facility] 10/12/201610:07 PM

## 2015-03-19 ENCOUNTER — Ambulatory Visit: Payer: BLUE CROSS/BLUE SHIELD | Admitting: Physician Assistant

## 2015-03-19 DIAGNOSIS — I10 Essential (primary) hypertension: Secondary | ICD-10-CM | POA: Insufficient documentation

## 2015-03-23 ENCOUNTER — Ambulatory Visit (INDEPENDENT_AMBULATORY_CARE_PROVIDER_SITE_OTHER): Payer: BLUE CROSS/BLUE SHIELD | Admitting: Physician Assistant

## 2015-03-23 ENCOUNTER — Encounter: Payer: Self-pay | Admitting: Physician Assistant

## 2015-03-23 VITALS — BP 136/88 | HR 90 | Temp 98.5°F | Resp 16 | Ht 62.5 in | Wt 194.2 lb

## 2015-03-23 DIAGNOSIS — M25561 Pain in right knee: Secondary | ICD-10-CM

## 2015-03-23 DIAGNOSIS — I10 Essential (primary) hypertension: Secondary | ICD-10-CM

## 2015-03-23 LAB — BASIC METABOLIC PANEL
BUN: 9 mg/dL (ref 7–25)
CHLORIDE: 100 mmol/L (ref 98–110)
CO2: 26 mmol/L (ref 20–31)
Calcium: 10 mg/dL (ref 8.6–10.2)
Creat: 0.64 mg/dL (ref 0.50–1.10)
GLUCOSE: 101 mg/dL — AB (ref 65–99)
POTASSIUM: 4.2 mmol/L (ref 3.5–5.3)
Sodium: 138 mmol/L (ref 135–146)

## 2015-03-23 MED ORDER — MELOXICAM 15 MG PO TABS: 7.5000 mg | ORAL_TABLET | Freq: Every day | ORAL | Status: DC

## 2015-03-23 NOTE — Progress Notes (Signed)
[facility] [address], [place] [address] [phone]  Date:  03/23/2015   Name:  [name]   DOB:  1971/03/22   MRN:  [id number]  PCP:  No PCP Per Patient    History of Present Illness:  [name] is a 44 y.o. female patient who presents to [facility] for follow up of htn and migraine like symptoms.  Placed 1 week ago on amlodipine and HCTZ. She notes marked improvement in headache.  This has since resolved.  BP has been around 142/82.  She is taking both medication during the day due to urinary changes of the diuretic. No leg swelling. She is changing her dietary habits--avoiding fried foods and salts. No chest pains, palpitaitons, sob, dyspnea and reduced leg swelling. Referral placed for Dr. Einar Gip.  Still awaiting contact for cardiology consult.  Patient has asked for refill of her percocet for her knee pain of right knee.  May use ever 2-3 days.  She states that she had an MRI, of knee, though can not recall orthopedist.  No findings.  States ibuprofen, muscle relaxants, due not help.  Patient Active Problem List   Diagnosis Date Noted  . Essential hypertension 03/19/2015  . Chronic cholecystitis 09/08/2011  . IRON DEFICIENCY 10/22/2009  . ABDOMINAL PAIN, LEFT UPPER QUADRANT 07/22/2009  . ESOPHAGEAL REFLUX 04/17/2009    Past Medical History  Diagnosis Date  . Heartburn   . Epigastric pain   . Gallstones   . Iron deficiency anemia   . Asthma     IN PAST NO RECENT PROBLEMS  . Headache(784.0)   . Blood clot in vein 2008    right leg    Past Surgical History  Procedure Laterality Date  . Tubal reversal    . Cholecystectomy  10/05/2011    Procedure: LAPAROSCOPIC CHOLECYSTECTOMY WITH INTRAOPERATIVE CHOLANGIOGRAM;  Surgeon: Pedro Earls, MD;  Location: [facility];  Service: General;  Laterality: N/A;  . Tubal ligation      Social History  Substance Use Topics  . Smoking status: Never Smoker   . Smokeless tobacco: Never Used  .  Alcohol Use: Yes     Comment: RARE    Family History  Problem Relation Age of Onset  . Diabetes      Family History  . Colon cancer Neg Hx   . Hypertension Mother   . Diabetes Mother   . Cancer Father     No Known Allergies  Medication list has been reviewed and updated.  Current Outpatient Prescriptions on File Prior to Visit  Medication Sig Dispense Refill  . acetaminophen (TYLENOL) 500 MG tablet Take 500 mg by mouth every 6 (six) hours as needed.    Marland Kitchen amLODipine (NORVASC) 5 MG tablet Take 1 tablet (5 mg total) by mouth daily. 30 tablet 1  . ferrous sulfate 325 (65 FE) MG tablet Take 325 mg by mouth daily with breakfast.    . hydrochlorothiazide (MICROZIDE) 12.5 MG capsule Take 1 capsule (12.5 mg total) by mouth daily. 30 capsule 1  . ibuprofen (ADVIL,MOTRIN) 800 MG tablet Take 1 tablet (800 mg total) by mouth every 8 (eight) hours as needed for mild pain. 30 tablet 0  . oxyCODONE-acetaminophen (PERCOCET) 5-325 MG per tablet Take 2 tablets by mouth every 6 (six) hours as needed. 15 tablet 0   No current facility-administered medications on file prior to visit.    ROS ROS otherwise unremarkable unless listed above.   Physical Examination: BP  148/96 mmHg  Pulse 90  Temp(Src) 98.5 F (36.9 C) (Oral)  Resp 16  Ht 5' 2.5" (1.588 m)  Wt 194 lb 3.2 oz (88.089 kg)  BMI 34.93 kg/m2  LMP 03/06/2015 Ideal Body Weight: Weight in (lb) to have BMI = 25: 138.6  Physical Exam  Constitutional: She is oriented to person, place, and time. She appears well-developed and well-nourished. No distress.  HENT:  Head: Normocephalic and atraumatic.  Right Ear: External ear normal.  Left Ear: External ear normal.  Eyes: Conjunctivae and EOM are normal. Pupils are equal, round, and reactive to light.  Cardiovascular: Normal rate, regular rhythm and intact distal pulses.  Exam reveals no gallop and no friction rub.   No murmur heard. Pulses:      Carotid pulses are 2+ on the right side,  and 2+ on the left side.      Radial pulses are 2+ on the right side, and 2+ on the left side.       Dorsalis pedis pulses are 2+ on the right side, and 2+ on the left side.  Pulmonary/Chest: Effort normal. No respiratory distress.  Musculoskeletal:       Right knee: She exhibits normal range of motion, no swelling and no bony tenderness. No medial joint line, no lateral joint line, no MCL, no LCL and no patellar tendon tenderness noted.  Right knee with mild crepitus.  Possible meniscal abnormality with mcmurray.  Neurological: She is alert and oriented to person, place, and time.  Skin: She is not diaphoretic.  Psychiatric: She has a normal mood and affect. Her behavior is normal.     Assessment and Plan: [name] is a 44 y.o. female who is here today for follow up of htn and headache, and concern of knee pain. Advised to use mobic at this time 1/2 -1 tablet.  HTN improving.  Advised that we return in 6 months, unless cardiology consult determines different follow up.   Will attempt mobic at this time.  Advised to follow up if not improved.  Declines ortho consult at this time.  Essential hypertension - Plan: Basic metabolic panel  Right knee pain - Plan: meloxicam (MOBIC) 15 MG tablet  Ivar Drape, PA-C [facility] 03/23/2015 11:46 AM

## 2015-03-23 NOTE — Patient Instructions (Addendum)
Please get your physical within 3 months. I will contact you with the follow up after the visit with Ganji.   Continue to take your medications and let us know if there are any problems.

## 2015-03-25 ENCOUNTER — Encounter: Payer: Self-pay | Admitting: Physician Assistant

## 2015-05-25 ENCOUNTER — Other Ambulatory Visit: Payer: Self-pay | Admitting: Physician Assistant

## 2015-05-25 NOTE — Telephone Encounter (Signed)
LM  Patient was suppose to follow up in 1 week after she was on her meds per [facility] note.

## 2015-05-25 NOTE — Telephone Encounter (Signed)
Pt called to say she just saw provider last month and doesn't understand Why she needs to come again to have both her bp meds filled???   Best phone for pt is [place]

## 2015-07-04 ENCOUNTER — Ambulatory Visit (INDEPENDENT_AMBULATORY_CARE_PROVIDER_SITE_OTHER): Payer: BLUE CROSS/BLUE SHIELD

## 2015-07-04 ENCOUNTER — Ambulatory Visit (INDEPENDENT_AMBULATORY_CARE_PROVIDER_SITE_OTHER): Payer: BLUE CROSS/BLUE SHIELD | Admitting: Internal Medicine

## 2015-07-04 VITALS — BP 128/80 | HR 97 | Temp 98.5°F | Resp 17 | Ht 64.0 in | Wt 200.0 lb

## 2015-07-04 DIAGNOSIS — M2391 Unspecified internal derangement of right knee: Secondary | ICD-10-CM | POA: Diagnosis not present

## 2015-07-04 DIAGNOSIS — I10 Essential (primary) hypertension: Secondary | ICD-10-CM | POA: Diagnosis not present

## 2015-07-04 DIAGNOSIS — R635 Abnormal weight gain: Secondary | ICD-10-CM | POA: Diagnosis not present

## 2015-07-04 DIAGNOSIS — M25561 Pain in right knee: Secondary | ICD-10-CM | POA: Diagnosis not present

## 2015-07-04 MED ORDER — OXYCODONE-ACETAMINOPHEN 5-325 MG PO TABS: 1.0000 | ORAL_TABLET | Freq: Four times a day (QID) | ORAL | Status: DC | PRN

## 2015-07-04 MED ORDER — LISINOPRIL-HYDROCHLOROTHIAZIDE 10-12.5 MG PO TABS: 1.0000 | ORAL_TABLET | Freq: Every day | ORAL | Status: DC

## 2015-07-04 MED ORDER — MELOXICAM 15 MG PO TABS: 15.0000 mg | ORAL_TABLET | Freq: Every day | ORAL | Status: DC

## 2015-07-04 NOTE — Progress Notes (Addendum)
Subjective:  This chart was scribed for Tami Lin, MD by [facility], medical scribe at [facility].The patient was seen in exam room 08 and the patient's care was started at 11:47 AM.   Patient ID: [name], female    DOB: 1970-10-31, 45 y.o.   MRN: [id number] Chief Complaint  Patient presents with  . Medication Refill    amlodipine, hctz   . fluid on knee   HPI HPI Comments: [name] is a 45 y.o. female who [facility]. She is currently taking amlodipine and HCTZ. She would like to switch to a combo pill. Recently stressed, which she believes is the reason for her elevated BP. Fhx of HTN. No cardiac issues. 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 The knee does not bother her in the morning when she first wakes up but the pain does gradually worsen throughout the day. She would like the knee drained today. Elevating her foot improves the swelling and tylenol does provide some relief to the pain. No Hx of gout.No past hx of injury remembered. No other jt problems  Works CNA/CMA at [employer]  Past Medical History  Diagnosis Date  . Heartburn   . Epigastric pain   . Gallstones   . Iron deficiency anemia   . Asthma     IN PAST NO RECENT PROBLEMS  . Headache(784.0)   . Blood clot in vein 2008    right leg   Prior to Admission medications   Medication Sig Start Date End Date Taking? Authorizing Provider  acetaminophen (TYLENOL) 500 MG tablet Take 500 mg by mouth every 6 (six) hours as needed.   Yes Historical Provider, MD  amLODipine (NORVASC) 5 MG tablet Take 1 tablet by mouth every day.  "follow up needed for refills 05/25/15  Yes Dorian Heckle English, PA  ferrous sulfate 325 (65 FE) MG tablet Take 325 mg by mouth daily with breakfast.   Yes Historical  Provider, MD  hydrochlorothiazide (MICROZIDE) 12.5 MG capsule Take one capsule by mouth every day  "office visit needed for refills" 05/25/15  Yes Dorian Heckle English, PA  ibuprofen (ADVIL,MOTRIN) 800 MG tablet Take 1 tablet (800 mg total) by mouth every 8 (eight) hours as needed for mild pain. 03/22/14  Yes Wandra Arthurs, MD   No Known Allergies  Review of Systems  Eyes: Negative for visual disturbance.  Respiratory: Negative for shortness of breath.   Cardiovascular: Negative for chest pain and palpitations.  Gastrointestinal: Negative for abdominal pain.  Musculoskeletal: Positive for joint swelling and arthralgias.  Neurological: Negative for headaches.      Objective:  BP 128/80 mmHg  Pulse 97  Temp(Src) 98.5 F (36.9 C) (Oral)  Resp 17  Ht 5\' 4"  (1.626 m)  Wt 200 lb (90.719 kg)  BMI 34.31 kg/m2  SpO2 98%  LMP 06/16/2015 Physical Exam  Constitutional: She is oriented to person, place, and time. She appears well-developed and well-nourished. No distress.  HENT:  Head: Normocephalic and atraumatic.  Eyes: Pupils are equal, round, and reactive to light.  Neck: Normal range of motion.  Cardiovascular: Normal rate and regular rhythm.   Pulmonary/Chest: Effort normal. No respiratory distress.  Musculoskeletal: Normal range of motion.  Right knee is diffusely swollen with  no or minimal effusion. The main tenderness is along th joint line without inflammation. Knee exam is stable to stressors. ROM is full, no swelling or tenderness in the upper leg or calf. No neurovascular losses in the distal extremities.  Pop with mcM  Neurological: She is alert and oriented to person, place, and time.  Skin: Skin is warm and dry.  Psychiatric: She has a normal mood and affect. Her behavior is normal.  Nursing note and vitals reviewed. BP 128/80 mmHg  Pulse 97  Temp(Src) 98.5 F (36.9 C) (Oral)  Resp 17  Ht 5\' 4"  (1.626 m)  Wt 200 lb (90.719 kg)  BMI 34.31 kg/m2  SpO2 98%  LMP  06/16/2015   [facility] reading (PRIMARY) by  Dr. Laney Pastor avulsion  Lat intercond tubercle--degen chg medIC tub as well.  Procedure: Under sterile conditions aspiration was attempted after 3 mL 2% Xylocaine anesthesia No fluid was obtained tho joint space was easily accessed 40 mg Kenalog was injected in knee was covered with compressive dressing    Assessment & Plan:  Pain in right knee -  Internal derangement of knee, right - Plan: Ambulatory referral to Orthopedic Surgery  Essential hypertension--changed to lisinopril/HCT  Weight gain--discussed weight loss efforts  Meds ordered this encounter  Medications  . lisinopril-hydrochlorothiazide (PRINZIDE,ZESTORETIC) 10-12.5 MG tablet    Sig: Take 1 tablet by mouth daily.    Dispense:  90 tablet    Refill:  1  . meloxicam (MOBIC) 15 MG tablet    Sig: Take 1 tablet (15 mg total) by mouth daily.    Dispense:  30 tablet    Refill:  0  . oxyCODONE-acetaminophen (ROXICET) 5-325 MG tablet    Sig: Take 1-2 tablets by mouth every 6 (six) hours as needed for severe pain.    Dispense:  12 tablet    Refill:  0   Ref ortho next  By signing my name below, I, Nadim Abuhashem, attest that this documentation has been prepared under the direction and in the presence of Tami Lin, MD.  Electronically Signed: Lora Havens, medical scribe. 07/04/2015, 11:57 AM.

## 2015-07-04 NOTE — Discharge Instructions (Signed)
Because you received an x-ray today, you will receive an invoice from [facility]. Please contact [facility] at [phone] with questions or concerns regarding your invoice. Our billing staff will not be able to assist you with those questions. °

## 2015-09-25 ENCOUNTER — Other Ambulatory Visit: Payer: Self-pay | Admitting: Obstetrics and Gynecology

## 2015-09-25 NOTE — Patient Instructions (Addendum)
Your procedure is scheduled on:  Monday, Oct 12, 2015  Enter through the Main Entrance of [facility] at:  11:30 AM  Pick up the phone at the desk and dial [phone].  Call this number if you have problems the morning of surgery: [phone].  Remember:  Do NOT eat food:  After Midnight Sunday, Oct 11, 2015  Do NOT drink clear liquids after:  7:00 AM day of surgery  Take these medicines the morning of surgery with a SIP OF WATER:  Lisinopril  Do NOT wear jewelry (body piercing), metal hair clips/bobby pins, make-up, or nail polish. Do NOT wear lotions, powders, or perfumes.  You may wear deodorant. Do NOT shave for 48 hours prior to surgery. Do NOT bring valuables to the hospital. Contacts, dentures, or bridgework may not be worn into surgery.  Leave suitcase in car.  After surgery it may be brought to your room.  For patients admitted to the hospital, checkout time is 11:00 AM the day of discharge.

## 2015-09-28 ENCOUNTER — Encounter (HOSPITAL_COMMUNITY): Payer: Self-pay

## 2015-09-28 ENCOUNTER — Encounter (HOSPITAL_COMMUNITY)
Admission: RE | Admit: 2015-09-28 | Discharge: 2015-09-28 | Disposition: A | Discharge status: Home / Self Care | Payer: BLUE CROSS/BLUE SHIELD | Source: Ambulatory Visit | Attending: Obstetrics and Gynecology | Admitting: Obstetrics and Gynecology

## 2015-09-28 DIAGNOSIS — D259 Leiomyoma of uterus, unspecified: Secondary | ICD-10-CM | POA: Diagnosis not present

## 2015-09-28 DIAGNOSIS — Z01812 Encounter for preprocedural laboratory examination: Secondary | ICD-10-CM | POA: Insufficient documentation

## 2015-09-28 DIAGNOSIS — D649 Anemia, unspecified: Secondary | ICD-10-CM | POA: Diagnosis not present

## 2015-09-28 DIAGNOSIS — N946 Dysmenorrhea, unspecified: Secondary | ICD-10-CM | POA: Insufficient documentation

## 2015-09-28 DIAGNOSIS — N939 Abnormal uterine and vaginal bleeding, unspecified: Secondary | ICD-10-CM | POA: Insufficient documentation

## 2015-09-28 HISTORY — DX: Depression, unspecified: F32.A

## 2015-09-28 HISTORY — DX: Major depressive disorder, single episode, unspecified: F32.9

## 2015-09-28 HISTORY — DX: Anxiety disorder, unspecified: F41.9

## 2015-09-28 HISTORY — DX: Essential (primary) hypertension: I10

## 2015-09-28 LAB — BASIC METABOLIC PANEL
Anion gap: 8 (ref 5–15)
BUN: 10 mg/dL (ref 6–20)
CO2: 26 mmol/L (ref 22–32)
Calcium: 9.8 mg/dL (ref 8.9–10.3)
Chloride: 104 mmol/L (ref 101–111)
Creatinine, Ser: 0.7 mg/dL (ref 0.44–1.00)
GFR calc non Af Amer: >60 mL/min (ref >60)
Glucose, Bld: 120 mg/dL — ABNORMAL HIGH (ref 65–99)
Potassium: 4.1 mmol/L (ref 3.5–5.1)
Sodium: 138 mmol/L (ref 135–145)

## 2015-09-28 LAB — ABO/RH: ABO/RH(D): B POS

## 2015-09-28 LAB — CBC
HEMATOCRIT: 30 % — AB (ref 36.0–46.0)
Hemoglobin: 9.2 g/dL — ABNORMAL LOW (ref 12.0–15.0)
MCH: 18.9 pg — ABNORMAL LOW (ref 26.0–34.0)
MCHC: 30.7 g/dL (ref 30.0–36.0)
MCV: 61.7 fL — AB (ref 78.0–100.0)
Platelets: 404 10*3/uL — ABNORMAL HIGH (ref 150–400)
RBC: 4.86 MIL/uL (ref 3.87–5.11)
RDW: 19.4 % — ABNORMAL HIGH (ref 11.5–15.5)
WBC: 7 10*3/uL (ref 4.0–10.5)

## 2015-09-28 LAB — SURGICAL PCR SCREEN
MRSA, PCR: NEGATIVE
STAPHYLOCOCCUS AUREUS: POSITIVE — AB

## 2015-09-28 LAB — TYPE AND SCREEN
ABO/RH(D): B POS
Antibody Screen: NEGATIVE

## 2015-09-29 ENCOUNTER — Ambulatory Visit (INDEPENDENT_AMBULATORY_CARE_PROVIDER_SITE_OTHER): Payer: BLUE CROSS/BLUE SHIELD | Admitting: Emergency Medicine

## 2015-09-29 VITALS — BP 123/78 | HR 93 | Temp 98.1°F | Resp 18 | Ht 62.0 in | Wt 197.0 lb

## 2015-09-29 DIAGNOSIS — R059 Cough, unspecified: Secondary | ICD-10-CM

## 2015-09-29 DIAGNOSIS — R942 Abnormal results of pulmonary function studies: Secondary | ICD-10-CM

## 2015-09-29 DIAGNOSIS — I10 Essential (primary) hypertension: Secondary | ICD-10-CM | POA: Diagnosis not present

## 2015-09-29 DIAGNOSIS — R05 Cough: Secondary | ICD-10-CM | POA: Diagnosis not present

## 2015-09-29 MED ORDER — HYDROCHLOROTHIAZIDE 12.5 MG PO CAPS: 12.5000 mg | ORAL_CAPSULE | Freq: Every day | ORAL | Status: DC

## 2015-09-29 MED ORDER — HYDROCODONE-HOMATROPINE 5-1.5 MG/5ML PO SYRP
5.0000 mL | ORAL_SOLUTION | Freq: Three times a day (TID) | ORAL | Status: DC | PRN
Start: 2015-09-29 — End: 2016-12-27

## 2015-09-29 MED ORDER — AMLODIPINE BESYLATE 5 MG PO TABS: 5.0000 mg | ORAL_TABLET | Freq: Every day | ORAL | Status: DC

## 2015-09-29 NOTE — Patient Instructions (Signed)
     IF you received an x-ray today, you will receive an invoice from [facility]. Please contact [facility] at [phone] with questions or concerns regarding your invoice.   IF you received labwork today, you will receive an invoice from [facility]. Please contact [facility] at [phone] with questions or concerns regarding your invoice.   Our billing staff will not be able to assist you with questions regarding bills from these companies.  You will be contacted with the lab results as soon as they are available. The fastest way to get your results is to activate your My Chart account. Instructions are located on the last page of this paperwork. If you have not heard from us regarding the results in 2 weeks, please contact this office.      

## 2015-09-29 NOTE — Progress Notes (Addendum)
Patient ID: [name], female   DOB: 1971-03-22, 45 y.o.   MRN: [id number]    By signing my name below, I, [name], attest that this documentation has been prepared under the direction and in the presence of Darlyne Russian, MD Electronically Signed: Ladene Artist, ED Scribe 09/29/2015 at 12:50 PM.  Chief Complaint:  Chief Complaint  Patient presents with  . Cough    x 2-3 weeks   HPI: [name] is a 45 y.o. female who reports to [facility] today complaining of persistent dry cough for the past 2-3 weeks. Pt states that cough is aggravated with swallowing and at night. She reports associated rhinorrhea for the past few weeks as well. Pt has tried Mucinex, allergy medications and cough syrup without significant relief. She denies nasal congestion, wheezing, acid reflux, heartburn. Pt states that she has been taking Lisinopril-HCTZ for a while. No possibility of pregnancy. She has an upcoming hysterectomy scheduled due to fibroids.   Past Medical History  Diagnosis Date  . Heartburn   . Epigastric pain   . Gallstones   . Iron deficiency anemia   . Asthma     IN PAST NO RECENT PROBLEMS  . Blood clot in vein 2008    right leg  . Hypertension   . Anxiety   . Depression   . Headache(784.0)     Migraines   Past Surgical History  Procedure Laterality Date  . Tubal reversal    . Cholecystectomy  10/05/2011    Procedure: LAPAROSCOPIC CHOLECYSTECTOMY WITH INTRAOPERATIVE CHOLANGIOGRAM;  Surgeon: Pedro Earls, MD;  Location: [facility];  Service: General;  Laterality: N/A;  . Tubal ligation    . Colonoscopy     Social History   Social History  . Marital Status: Married    Spouse Name: N/A  . Number of Children: 3  . 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     Social History Main Topics  . Smoking status: Never Smoker   . Smokeless tobacco: Never Used  . Alcohol Use: Yes     Comment: RARE  . Drug Use: No  . Sexual Activity: Yes    Birth  Control/ Protection: None   Other Topics Concern  . None   Social History Narrative   2 caffeine drinks daily   Patient does not get regular exercise.   Family History  Problem Relation Age of Onset  . Diabetes      Family History  . Colon cancer Neg Hx   . Hypertension Mother   . Diabetes Mother   . Cancer Father    No Known Allergies Prior to Admission medications   Medication Sig Start Date End Date Taking? Authorizing Provider  acetaminophen (TYLENOL) 500 MG tablet Take 500 mg by mouth every 6 (six) hours as needed for mild pain or headache.    Yes Historical Provider, MD  cetirizine (ZYRTEC) 10 MG tablet Take 10 mg by mouth daily.   Yes Historical Provider, MD  DiphenhydrAMINE HCl (ZZZQUIL) 50 MG/30ML LIQD Take 30 mLs by mouth at bedtime as needed (for allergies and sleep).   Yes Historical Provider, MD  ferrous sulfate 325 (65 FE) MG tablet Take 325 mg by mouth daily with breakfast.   Yes Historical Provider, MD  ibuprofen (ADVIL,MOTRIN) 800 MG tablet Take 1 tablet (800 mg total) by mouth every 8 (eight) hours as needed for mild pain. 03/22/14  Yes Shanon Brow  Tamsen Meek, MD  lisinopril-hydrochlorothiazide (PRINZIDE,ZESTORETIC) 10-12.5 MG tablet Take 1 tablet by mouth daily. 07/04/15  Yes Leandrew Koyanagi, MD  meloxicam (MOBIC) 15 MG tablet Take 1 tablet (15 mg total) by mouth daily. Patient taking differently: Take 15 mg by mouth daily as needed for pain.  07/04/15  Yes Leandrew Koyanagi, MD  oxyCODONE-acetaminophen (ROXICET) 5-325 MG tablet Take 1-2 tablets by mouth every 6 (six) hours as needed for severe pain. Patient not taking: Reported on 09/18/2015 07/04/15   Leandrew Koyanagi, MD   ROS: The patient denies fevers, chills, night sweats, unintentional weight loss, chest pain, palpitations, wheezing, dyspnea on exertion, nausea, vomiting, abdominal pain, dysuria, hematuria, melena, numbness, weakness, or tingling.   All other systems have been reviewed and were otherwise negative  with the exception of those mentioned in the HPI and as above.    PHYSICAL EXAM: Filed Vitals:   09/29/15 1159  BP: 123/78  Pulse: 93  Temp: 98.1 F (36.7 C)  Resp: 18   Body mass index is 36.02 kg/(m^2).  General: Alert, no acute distress HEENT:  Normocephalic, atraumatic, oropharynx patent. Minimal swelling of the uvula. TMs normal.  Neck: Supple.  Eye: [facility] Cardiovascular: Regular rate and rhythm, no rubs murmurs or gallops. No Carotid bruits, radial pulse intact. No pedal edema.  Respiratory: Clear to auscultation bilaterally. No wheezes, rales, or rhonchi. No cyanosis, no use of accessory musculature Abdominal: No organomegaly, abdomen is soft and non-tender, positive bowel sounds. No masses. Musculoskeletal: Gait intact. No edema, tenderness Skin: No rashes. Neurologic: Facial musculature symmetric. Psychiatric: Patient acts appropriately throughout our interaction. Lymphatic: No cervical or submandibular lymphadenopathy Meds ordered this encounter  Medications  . hydrochlorothiazide (MICROZIDE) 12.5 MG capsule    Sig: Take 1 capsule (12.5 mg total) by mouth daily.    Dispense:  30 capsule    Refill:  11  . amLODipine (NORVASC) 5 MG tablet    Sig: Take 1 tablet (5 mg total) by mouth daily.    Dispense:  30 tablet    Refill:  11  . HYDROcodone-homatropine (HYCODAN) 5-1.5 MG/5ML syrup    Sig: Take 5 mLs by mouth every 8 (eight) hours as needed for cough.    Dispense:  120 mL    Refill:  0     LABS:     EKG/XRAY:   Primary read interpreted by Dr. Everlene Farrier at [facility].    ASSESSMENT/PLAN: Start with a stoppage of her lisinopril. She will be on HCTZ 12.5 a day. She will be on amlodipine 5 mg a day. She will recheck in 2 weeks and is continuing to have trouble we'll pursue testing for asthma. She was given Hycodan to have at night for cough. I personally performed the services described in this documentation, which was scribed in my presence. The recorded  information has been reviewed and is accurate. She could not stay today to have further evaluation of possible asthma. She did agree to come back so we could evaluate this further. She has been using an albuterol inhaler from a coworker. This has not helped her so far.I personally performed the services described in this documentation, which was scribed in my presence. The recorded information has been reviewed and is accurate.   Gross sideeffects, risk and benefits, and alternatives of medications d/w patient. Patient is aware that all medications have potential sideeffects and we are unable to predict every sideeffect or drug-drug interaction that may occur.  Arlyss Queen MD 09/29/2015 12:40 PM

## 2015-10-11 NOTE — H&P (Signed)
[name], [name]              ACCOUNT NO.:  [id number]  MEDICAL RECORD NO.:  [id number]  LOCATION:  PERIO                         FACILITY:  [facility]  PHYSICIAN:  Lovenia Kim, M.D.DATE OF BIRTH:  1970/08/10  DATE OF ADMISSION:  09/03/2015 DATE OF DISCHARGE:                             HISTORY & PHYSICAL   PREOPERATIVE DIAGNOSES:  Refractory dysmenorrhea, abnormal uterine bleeding, and menorrhagia with symptomatic fibroids.  HISTORY OF PRESENT ILLNESS:  A 45 year old white female, G4, P3, who presents for definitive therapy.  She has no known drug allergies. Medications are iron, lisinopril, Tylenol as needed.  Nonsmoker, nondrinker.  She denies domestic or physical violence.  She has a history of two vaginal deliveries, one ectopic pregnancy.  SURGICAL HISTORY:  Cholecystectomy, tubal ligation, and tubal reversal.  FAMILY HISTORY:  Diabetes, hypertension.  PHYSICAL EXAMINATION:  GENERAL:  A well-developed, well-nourished [place] American female, in no acute distress. HEENT:  Normal. NECK:  Supple.  Full range of motion. LUNGS:  Clear. HEART:  Regular rate and rhythm. ABDOMEN:  Soft, nontender.  Pelvic exam reveals 10-12 week size uterus. No adnexal masses. EXTREMITIES:  There are no cords. NEUROLOGIC:  Nonfocal. SKIN:  Intact.  IMPRESSION:  Symptomatic fibroids with menometrorrhagia and secondary anemia.  Normal endometrial biopsy in April, 2017.  PLAN:  Proceed with da Vinci assisted total laparoscopic hysterectomy, bilateral salpingectomy.  Risks of anesthesia, infection, bleeding, injury to surrounding organs, possible need for repair was discussed. Delayed versus immediate complications to include bowel and bladder injury noted.  The patient acknowledges and wishes to proceed.    Lovenia Kim, M.D.    RJT/MEDQ  D:  10/11/2015  T:  10/11/2015  Job:  [id number]

## 2015-10-12 ENCOUNTER — Ambulatory Visit (HOSPITAL_COMMUNITY): Payer: BLUE CROSS/BLUE SHIELD | Admitting: Anesthesiology

## 2015-10-12 ENCOUNTER — Ambulatory Visit (HOSPITAL_COMMUNITY)
Admission: RE | Admit: 2015-10-12 | Discharge: 2015-10-13 | Disposition: A | Discharge status: Home / Self Care | Payer: BLUE CROSS/BLUE SHIELD | Source: Ambulatory Visit | Attending: Obstetrics and Gynecology | Admitting: Obstetrics and Gynecology

## 2015-10-12 ENCOUNTER — Encounter (HOSPITAL_COMMUNITY)
Admission: RE | Disposition: A | Discharge status: Home / Self Care | Payer: Self-pay | Source: Ambulatory Visit | Attending: Obstetrics and Gynecology

## 2015-10-12 ENCOUNTER — Encounter (HOSPITAL_COMMUNITY): Payer: Self-pay | Admitting: Anesthesiology

## 2015-10-12 DIAGNOSIS — I10 Essential (primary) hypertension: Secondary | ICD-10-CM | POA: Insufficient documentation

## 2015-10-12 DIAGNOSIS — D259 Leiomyoma of uterus, unspecified: Secondary | ICD-10-CM | POA: Diagnosis not present

## 2015-10-12 DIAGNOSIS — D251 Intramural leiomyoma of uterus: Secondary | ICD-10-CM | POA: Diagnosis not present

## 2015-10-12 DIAGNOSIS — N920 Excessive and frequent menstruation with regular cycle: Secondary | ICD-10-CM | POA: Diagnosis present

## 2015-10-12 DIAGNOSIS — D500 Iron deficiency anemia secondary to blood loss (chronic): Secondary | ICD-10-CM | POA: Insufficient documentation

## 2015-10-12 DIAGNOSIS — N946 Dysmenorrhea, unspecified: Secondary | ICD-10-CM | POA: Diagnosis not present

## 2015-10-12 DIAGNOSIS — N939 Abnormal uterine and vaginal bleeding, unspecified: Secondary | ICD-10-CM | POA: Diagnosis not present

## 2015-10-12 DIAGNOSIS — D219 Benign neoplasm of connective and other soft tissue, unspecified: Secondary | ICD-10-CM | POA: Diagnosis present

## 2015-10-12 DIAGNOSIS — K219 Gastro-esophageal reflux disease without esophagitis: Secondary | ICD-10-CM | POA: Diagnosis not present

## 2015-10-12 DIAGNOSIS — J45909 Unspecified asthma, uncomplicated: Secondary | ICD-10-CM | POA: Diagnosis not present

## 2015-10-12 HISTORY — PX: ROBOTIC ASSISTED TOTAL HYSTERECTOMY WITH SALPINGECTOMY: SHX6679

## 2015-10-12 SURGERY — ROBOTIC ASSISTED TOTAL HYSTERECTOMY WITH SALPINGECTOMY
Anesthesia: General | Laterality: Bilateral

## 2015-10-12 MED ORDER — BUPIVACAINE HCL (PF) 0.25 % IJ SOLN
INTRAMUSCULAR | Status: AC
  Filled 2015-10-12: qty 30

## 2015-10-12 MED ORDER — ROCURONIUM BROMIDE 100 MG/10ML IV SOLN
INTRAVENOUS | Status: DC | PRN
  Administered 2015-10-12: 20 mg via INTRAVENOUS
  Administered 2015-10-12: 10 mg via INTRAVENOUS
  Administered 2015-10-12: 50 mg via INTRAVENOUS
  Administered 2015-10-12: 20 mg via INTRAVENOUS

## 2015-10-12 MED ORDER — ALBUTEROL SULFATE (2.5 MG/3ML) 0.083% IN NEBU
INHALATION_SOLUTION | RESPIRATORY_TRACT | Status: AC
  Filled 2015-10-12: qty 3

## 2015-10-12 MED ORDER — KETOROLAC TROMETHAMINE 30 MG/ML IJ SOLN: 30.0000 mg | Freq: Once | INTRAMUSCULAR | Status: DC

## 2015-10-12 MED ORDER — SCOPOLAMINE 1 MG/3DAYS TD PT72
1.0000 | MEDICATED_PATCH | TRANSDERMAL | Status: DC
  Administered 2015-10-12: 1.5 mg via TRANSDERMAL

## 2015-10-12 MED ORDER — OXYCODONE-ACETAMINOPHEN 5-325 MG PO TABS
1.0000 | ORAL_TABLET | ORAL | Status: DC | PRN
  Administered 2015-10-13: 2 via ORAL
  Administered 2015-10-13: 1 via ORAL
  Filled 2015-10-12: qty 1
  Filled 2015-10-12: qty 2

## 2015-10-12 MED ORDER — AMLODIPINE BESYLATE 5 MG PO TABS
5.0000 mg | ORAL_TABLET | Freq: Every day | ORAL | Status: DC
  Administered 2015-10-13: 5 mg via ORAL
  Filled 2015-10-12 (×2): qty 1

## 2015-10-12 MED ORDER — ONDANSETRON HCL 4 MG/2ML IJ SOLN
INTRAMUSCULAR | Status: AC
Start: 2015-10-12 — End: 2015-10-12
  Filled 2015-10-12: qty 2

## 2015-10-12 MED ORDER — ONDANSETRON HCL 4 MG/2ML IJ SOLN
INTRAMUSCULAR | Status: DC | PRN
  Administered 2015-10-12: 4 mg via INTRAVENOUS

## 2015-10-12 MED ORDER — KETOROLAC TROMETHAMINE 30 MG/ML IJ SOLN
INTRAMUSCULAR | Status: AC
Start: 2015-10-12 — End: 2015-10-12
  Filled 2015-10-12: qty 1

## 2015-10-12 MED ORDER — ALBUTEROL SULFATE (2.5 MG/3ML) 0.083% IN NEBU
2.5000 mg | INHALATION_SOLUTION | Freq: Once | RESPIRATORY_TRACT | Status: AC
  Administered 2015-10-12: 2.5 mg via RESPIRATORY_TRACT

## 2015-10-12 MED ORDER — ONDANSETRON HCL 4 MG/2ML IJ SOLN
4.0000 mg | Freq: Four times a day (QID) | INTRAMUSCULAR | Status: DC | PRN
Start: 2015-10-12 — End: 2015-10-13
  Administered 2015-10-12: 4 mg via INTRAVENOUS
  Filled 2015-10-12 (×2): qty 2

## 2015-10-12 MED ORDER — DIPHENHYDRAMINE HCL 12.5 MG/5ML PO ELIX: 12.5000 mg | ORAL_SOLUTION | Freq: Four times a day (QID) | ORAL | Status: DC | PRN

## 2015-10-12 MED ORDER — PHENYLEPHRINE 40 MCG/ML (10ML) SYRINGE FOR IV PUSH (FOR BLOOD PRESSURE SUPPORT)
PREFILLED_SYRINGE | INTRAVENOUS | Status: AC
  Filled 2015-10-12: qty 10

## 2015-10-12 MED ORDER — SODIUM CHLORIDE 0.9 % IJ SOLN
INTRAMUSCULAR | Status: DC | PRN
  Administered 2015-10-12: 10 mL

## 2015-10-12 MED ORDER — FENTANYL CITRATE (PF) 100 MCG/2ML IJ SOLN
INTRAMUSCULAR | Status: AC
  Filled 2015-10-12: qty 2

## 2015-10-12 MED ORDER — BUPIVACAINE HCL (PF) 0.25 % IJ SOLN
INTRAMUSCULAR | Status: DC | PRN
  Administered 2015-10-12: 30 mL

## 2015-10-12 MED ORDER — HYDROCHLOROTHIAZIDE 12.5 MG PO CAPS
12.5000 mg | ORAL_CAPSULE | Freq: Every day | ORAL | Status: DC
  Administered 2015-10-13: 12.5 mg via ORAL
  Filled 2015-10-12 (×3): qty 1

## 2015-10-12 MED ORDER — FERROUS SULFATE 325 (65 FE) MG PO TABS
325.0000 mg | ORAL_TABLET | Freq: Every day | ORAL | Status: DC
  Administered 2015-10-13: 325 mg via ORAL
  Filled 2015-10-12: qty 1

## 2015-10-12 MED ORDER — LACTATED RINGERS IR SOLN
Status: DC | PRN
  Administered 2015-10-12: 3000 mL

## 2015-10-12 MED ORDER — LIDOCAINE HCL (CARDIAC) 20 MG/ML IV SOLN
INTRAVENOUS | Status: DC | PRN
  Administered 2015-10-12: 100 mg via INTRAVENOUS

## 2015-10-12 MED ORDER — ARTIFICIAL TEARS OP OINT
TOPICAL_OINTMENT | OPHTHALMIC | Status: AC
  Filled 2015-10-12: qty 3.5

## 2015-10-12 MED ORDER — SODIUM CHLORIDE 0.9 % IJ SOLN
INTRAMUSCULAR | Status: AC
  Filled 2015-10-12: qty 50

## 2015-10-12 MED ORDER — CEFAZOLIN SODIUM-DEXTROSE 2-3 GM-% IV SOLR
INTRAVENOUS | Status: AC
  Filled 2015-10-12: qty 50

## 2015-10-12 MED ORDER — LACTATED RINGERS IV SOLN
INTRAVENOUS | Status: DC
  Administered 2015-10-12 – 2015-10-13 (×2): via INTRAVENOUS

## 2015-10-12 MED ORDER — SODIUM CHLORIDE 0.9% FLUSH: 9.0000 mL | INTRAVENOUS | Status: DC | PRN

## 2015-10-12 MED ORDER — KETOROLAC TROMETHAMINE 30 MG/ML IJ SOLN
INTRAMUSCULAR | Status: DC | PRN
  Administered 2015-10-12: 30 mg via INTRAVENOUS

## 2015-10-12 MED ORDER — HYDROMORPHONE 1 MG/ML IV SOLN
INTRAVENOUS | Status: DC
  Administered 2015-10-12: 19:00:00 via INTRAVENOUS
  Administered 2015-10-12: 0.2 mg via INTRAVENOUS
  Administered 2015-10-13: 0.6 mg via INTRAVENOUS
  Administered 2015-10-13: 0.4 mg via INTRAVENOUS
  Filled 2015-10-12: qty 25

## 2015-10-12 MED ORDER — DEXAMETHASONE SODIUM PHOSPHATE 4 MG/ML IJ SOLN
INTRAMUSCULAR | Status: AC
  Filled 2015-10-12: qty 1

## 2015-10-12 MED ORDER — DEXAMETHASONE SODIUM PHOSPHATE 10 MG/ML IJ SOLN
INTRAMUSCULAR | Status: DC | PRN
Start: 2015-10-12 — End: 2015-10-12
  Administered 2015-10-12: 4 mg via INTRAVENOUS

## 2015-10-12 MED ORDER — HYDROMORPHONE HCL 1 MG/ML IJ SOLN
INTRAMUSCULAR | Status: DC | PRN
  Administered 2015-10-12: 1 mg via INTRAVENOUS

## 2015-10-12 MED ORDER — SODIUM CHLORIDE 0.9 % IV SOLN
INTRAVENOUS | Status: DC | PRN
  Administered 2015-10-12: 120 mL

## 2015-10-12 MED ORDER — HYDROMORPHONE HCL 1 MG/ML IJ SOLN
0.2500 mg | INTRAMUSCULAR | Status: DC | PRN
  Administered 2015-10-12 (×2): 0.25 mg via INTRAVENOUS

## 2015-10-12 MED ORDER — HYDROMORPHONE HCL 1 MG/ML IJ SOLN
INTRAMUSCULAR | Status: AC
  Filled 2015-10-12: qty 1

## 2015-10-12 MED ORDER — FENTANYL CITRATE (PF) 100 MCG/2ML IJ SOLN
INTRAMUSCULAR | Status: DC | PRN
  Administered 2015-10-12: 100 ug via INTRAVENOUS
  Administered 2015-10-12: 50 ug via INTRAVENOUS
  Administered 2015-10-12 (×3): 100 ug via INTRAVENOUS

## 2015-10-12 MED ORDER — ROCURONIUM BROMIDE 100 MG/10ML IV SOLN
INTRAVENOUS | Status: AC
  Filled 2015-10-12: qty 1

## 2015-10-12 MED ORDER — LACTATED RINGERS IV SOLN
INTRAVENOUS | Status: DC
  Administered 2015-10-12: 12:00:00 via INTRAVENOUS

## 2015-10-12 MED ORDER — MIDAZOLAM HCL 2 MG/2ML IJ SOLN
INTRAMUSCULAR | Status: AC
  Filled 2015-10-12: qty 2

## 2015-10-12 MED ORDER — PROPOFOL 10 MG/ML IV BOLUS
INTRAVENOUS | Status: DC | PRN
  Administered 2015-10-12: 200 mg via INTRAVENOUS

## 2015-10-12 MED ORDER — SUGAMMADEX SODIUM 200 MG/2ML IV SOLN
INTRAVENOUS | Status: DC | PRN
  Administered 2015-10-12: 200 mg via INTRAVENOUS

## 2015-10-12 MED ORDER — LIDOCAINE HCL (CARDIAC) 20 MG/ML IV SOLN
INTRAVENOUS | Status: AC
  Filled 2015-10-12: qty 5

## 2015-10-12 MED ORDER — TRAMADOL HCL 50 MG PO TABS
50.0000 mg | ORAL_TABLET | Freq: Four times a day (QID) | ORAL | Status: DC | PRN
  Administered 2015-10-12 – 2015-10-13 (×2): 50 mg via ORAL
  Filled 2015-10-12 (×2): qty 1

## 2015-10-12 MED ORDER — MIDAZOLAM HCL 2 MG/2ML IJ SOLN
INTRAMUSCULAR | Status: DC | PRN
  Administered 2015-10-12: 2 mg via INTRAVENOUS

## 2015-10-12 MED ORDER — ACETAMINOPHEN 10 MG/ML IV SOLN
1000.0000 mg | Freq: Once | INTRAVENOUS | Status: AC
  Administered 2015-10-12: 1000 mg via INTRAVENOUS
  Filled 2015-10-12: qty 100

## 2015-10-12 MED ORDER — ONDANSETRON HCL 4 MG/2ML IJ SOLN: 4.0000 mg | Freq: Once | INTRAMUSCULAR | Status: DC | PRN

## 2015-10-12 MED ORDER — DIPHENHYDRAMINE HCL 50 MG/ML IJ SOLN: 12.5000 mg | Freq: Four times a day (QID) | INTRAMUSCULAR | Status: DC | PRN

## 2015-10-12 MED ORDER — PROPOFOL 10 MG/ML IV BOLUS
INTRAVENOUS | Status: AC
  Filled 2015-10-12: qty 20

## 2015-10-12 MED ORDER — CEFAZOLIN SODIUM-DEXTROSE 2-4 GM/100ML-% IV SOLN
2.0000 g | INTRAVENOUS | Status: AC
  Administered 2015-10-12: 2 g via INTRAVENOUS
  Filled 2015-10-12: qty 100

## 2015-10-12 MED ORDER — MEPERIDINE HCL 25 MG/ML IJ SOLN: 6.2500 mg | INTRAMUSCULAR | Status: DC | PRN

## 2015-10-12 MED ORDER — ARTIFICIAL TEARS OP OINT
TOPICAL_OINTMENT | OPHTHALMIC | Status: DC | PRN
  Administered 2015-10-12: 1 via OPHTHALMIC

## 2015-10-12 MED ORDER — ROPIVACAINE HCL 5 MG/ML IJ SOLN
INTRAMUSCULAR | Status: AC
  Filled 2015-10-12: qty 60

## 2015-10-12 MED ORDER — KETOROLAC TROMETHAMINE 30 MG/ML IJ SOLN
INTRAMUSCULAR | Status: AC
  Filled 2015-10-12: qty 1

## 2015-10-12 MED ORDER — NALOXONE HCL 0.4 MG/ML IJ SOLN: 0.4000 mg | INTRAMUSCULAR | Status: DC | PRN

## 2015-10-12 MED ORDER — FENTANYL CITRATE (PF) 250 MCG/5ML IJ SOLN
INTRAMUSCULAR | Status: AC
  Filled 2015-10-12: qty 5

## 2015-10-12 MED ORDER — PHENYLEPHRINE HCL 10 MG/ML IJ SOLN
INTRAMUSCULAR | Status: DC | PRN
  Administered 2015-10-12 (×2): 80 ug via INTRAVENOUS
  Administered 2015-10-12: 40 ug via INTRAVENOUS

## 2015-10-12 MED ORDER — SODIUM CHLORIDE 0.9 % IJ SOLN
INTRAMUSCULAR | Status: AC
  Filled 2015-10-12: qty 20

## 2015-10-12 MED ORDER — SCOPOLAMINE 1 MG/3DAYS TD PT72
MEDICATED_PATCH | TRANSDERMAL | Status: AC
  Administered 2015-10-12: 1.5 mg via TRANSDERMAL
  Filled 2015-10-12: qty 1

## 2015-10-12 SURGICAL SUPPLY — 52 items
BARRIER ADHS 3X4 INTERCEED (GAUZE/BANDAGES/DRESSINGS) ×2 IMPLANT
CATH FOLEY 3WAY  5CC 16FR (CATHETERS) ×1
CATH FOLEY 3WAY 5CC 16FR (CATHETERS) ×1 IMPLANT
CELL SAVER LIPIGURD (MISCELLANEOUS) IMPLANT
CLOTH BEACON ORANGE TIMEOUT ST (SAFETY) ×2 IMPLANT
CONT PATH 16OZ SNAP LID 3702 (MISCELLANEOUS) ×2 IMPLANT
COVER BACK TABLE 60X90IN (DRAPES) ×4 IMPLANT
COVER TIP SHEARS 8 DVNC (MISCELLANEOUS) ×1 IMPLANT
COVER TIP SHEARS 8MM DA VINCI (MISCELLANEOUS) ×1
DECANTER SPIKE VIAL GLASS SM (MISCELLANEOUS) ×10 IMPLANT
DURAPREP 26ML APPLICATOR (WOUND CARE) ×2 IMPLANT
ELECT REM PT RETURN 9FT ADLT (ELECTROSURGICAL) ×2
ELECTRODE REM PT RTRN 9FT ADLT (ELECTROSURGICAL) ×1 IMPLANT
EXTRT SYSTEM ALEXIS 14CM (MISCELLANEOUS)
GAUZE VASELINE 3X9 (GAUZE/BANDAGES/DRESSINGS) IMPLANT
GLOVE BIO SURGEON STRL SZ7.5 (GLOVE) ×4 IMPLANT
GLOVE BIOGEL PI IND STRL 7.0 (GLOVE) ×3 IMPLANT
GLOVE BIOGEL PI INDICATOR 7.0 (GLOVE) ×3
KIT ACCESSORY DA VINCI DISP (KITS) ×1
KIT ACCESSORY DVNC DISP (KITS) ×1 IMPLANT
LEGGING LITHOTOMY PAIR STRL (DRAPES) ×2 IMPLANT
LIQUID BAND (GAUZE/BANDAGES/DRESSINGS) ×2 IMPLANT
NEEDLE INSUFFLATION 150MM (ENDOMECHANICALS) ×2 IMPLANT
OCCLUDER COLPOPNEUMO (BALLOONS) IMPLANT
PACK ROBOT WH (CUSTOM PROCEDURE TRAY) ×2 IMPLANT
PACK ROBOTIC GOWN (GOWN DISPOSABLE) ×2 IMPLANT
PAD PREP 24X48 CUFFED NSTRL (MISCELLANEOUS) ×4 IMPLANT
PAD TRENDELENBURG POSITION (MISCELLANEOUS) ×2 IMPLANT
SET CYSTO W/LG BORE CLAMP LF (SET/KITS/TRAYS/PACK) IMPLANT
SET IRRIG TUBING LAPAROSCOPIC (IRRIGATION / IRRIGATOR) ×2 IMPLANT
SET TRI-LUMEN FLTR TB AIRSEAL (TUBING) ×2 IMPLANT
SOLUTION ANTI FOG 6CC (MISCELLANEOUS) ×2 IMPLANT
SUT VIC AB 0 CT1 27 (SUTURE) ×2
SUT VIC AB 0 CT1 27XBRD ANBCTR (SUTURE) ×2 IMPLANT
SUT VICRYL 0 UR6 27IN ABS (SUTURE) ×2 IMPLANT
SUT VICRYL RAPIDE 4/0 PS 2 (SUTURE) ×4 IMPLANT
SUT VLOC 180 0 9IN  GS21 (SUTURE) ×1
SUT VLOC 180 0 9IN GS21 (SUTURE) ×1 IMPLANT
SYR 50ML LL SCALE MARK (SYRINGE) ×2 IMPLANT
SYRINGE 10CC LL (SYRINGE) ×4 IMPLANT
TIP RUMI ORANGE 6.7MMX12CM (TIP) ×2 IMPLANT
TIP UTERINE 5.1X6CM LAV DISP (MISCELLANEOUS) IMPLANT
TIP UTERINE 6.7X10CM GRN DISP (MISCELLANEOUS) IMPLANT
TIP UTERINE 6.7X6CM WHT DISP (MISCELLANEOUS) IMPLANT
TIP UTERINE 6.7X8CM BLUE DISP (MISCELLANEOUS) IMPLANT
TOWEL OR 17X24 6PK STRL BLUE (TOWEL DISPOSABLE) ×6 IMPLANT
TROCAR DISP BLADELESS 8 DVNC (TROCAR) ×1 IMPLANT
TROCAR DISP BLADELESS 8MM (TROCAR) ×1
TROCAR PORT AIRSEAL 5X120 (TROCAR) ×2 IMPLANT
TROCAR Z-THREAD 12X150 (TROCAR) ×2 IMPLANT
TUBING INSUFFLATION W/FILTER (TUBING) ×2 IMPLANT
WATER STERILE IRR 1000ML POUR (IV SOLUTION) ×6 IMPLANT

## 2015-10-12 NOTE — Anesthesia Preprocedure Evaluation (Signed)
Anesthesia Evaluation  Patient identified by MRN, date of birth, ID band Patient awake    Reviewed: Allergy & Precautions, H&P , NPO status , Patient's Chart, lab work & pertinent test results  Airway Mallampati: I  TM Distance: >3 FB Neck ROM: full    Dental no notable dental hx. (+) Teeth Intact   Pulmonary asthma ,  Remote asthma.  No problems now.   Pulmonary exam normal        Cardiovascular Exercise Tolerance: Good hypertension, Pt. on medications Normal cardiovascular exam     Neuro/Psych    GI/Hepatic Neg liver ROS, GERD  Medicated and Controlled,  Endo/Other  negative endocrine ROS  Renal/GU negative Renal ROS  negative genitourinary   Musculoskeletal   Abdominal (+) + obese,   Peds  Hematology Anemia Hgb. 8   Anesthesia Other Findings   Reproductive/Obstetrics negative OB ROS                             Anesthesia Physical  Anesthesia Plan  ASA: II  Anesthesia Plan: General   Post-op Pain Management:    Induction: Intravenous  Airway Management Planned: Oral ETT  Additional Equipment:   Intra-op Plan:   Post-operative Plan: Extubation in OR  Informed Consent: I have reviewed the patients History and Physical, chart, labs and discussed the procedure including the risks, benefits and alternatives for the proposed anesthesia with the patient or authorized representative who has indicated his/her understanding and acceptance.   Dental Advisory Given  Plan Discussed with: CRNA and Surgeon  Anesthesia Plan Comments:         Anesthesia Quick Evaluation

## 2015-10-12 NOTE — Anesthesia Postprocedure Evaluation (Signed)
Anesthesia Post Note  Patient: [name]  Procedure(s) Performed: Procedure(s) (LRB): ROBOTIC ASSISTED TOTAL HYSTERECTOMY WITH SALPINGECTOMY (Bilateral)  Patient location during evaluation: PACU Anesthesia Type: General Level of consciousness: sedated Pain management: satisfactory to patient Vital Signs Assessment: post-procedure vital signs reviewed and stable Respiratory status: spontaneous breathing Cardiovascular status: stable Anesthetic complications: no     Last Vitals:  Filed Vitals:   10/12/15 1800 10/12/15 1815  BP: 121/74   Pulse:    Temp:  36.8 C  Resp:      Last Pain:  Filed Vitals:   10/12/15 1824  PainSc: 0-No pain   Pain Goal: Patients Stated Pain Goal: 4 (10/12/15 1745)               Lyndle Herrlich EDWARD

## 2015-10-12 NOTE — Progress Notes (Signed)
Patient ID: [name], female   DOB: [birth year], 45 y.o.   MRN: [id number] Patient seen and examined. Consent witnessed and signed. No changes noted. Update completed. CBC    Component Value Date/Time   WBC 7.0 09/28/2015 1510   WBC 6.5 03/18/2015 1529   RBC 4.86 09/28/2015 1510   RBC 4.73 03/18/2015 1529   RBC 4.22 10/06/2011 0759   HGB 9.2* 09/28/2015 1510   HGB 9.9* 03/18/2015 1529   HCT 30.0* 09/28/2015 1510   HCT 31.8* 03/18/2015 1529   PLT 404* 09/28/2015 1510   MCV 61.7* 09/28/2015 1510   MCV 67.2* 03/18/2015 1529   MCH 18.9* 09/28/2015 1510   MCH 21.0* 03/18/2015 1529   MCHC 30.7 09/28/2015 1510   MCHC 31.2* 03/18/2015 1529   RDW 19.4* 09/28/2015 1510   LYMPHSABS 1.5 10/08/2011 1700   MONOABS 0.4 10/08/2011 1700   EOSABS 0.1 10/08/2011 1700   BASOSABS 0.0 10/08/2011 1700    Significant anemia noted and discussed. Transfusions risks vs benefits discussed.

## 2015-10-12 NOTE — Anesthesia Procedure Notes (Signed)
Procedure Name: Intubation Date/Time: 10/12/2015 12:58 PM Performed by: Raenette Rover Pre-anesthesia Checklist: Patient identified, Emergency Drugs available, Suction available and Patient being monitored Patient Re-evaluated:Patient Re-evaluated prior to inductionOxygen Delivery Method: Circle system utilized Preoxygenation: Pre-oxygenation with 100% oxygen Intubation Type: IV induction Ventilation: Mask ventilation without difficulty Laryngoscope Size: Mac and 3 Grade View: Grade I Tube type: Oral Tube size: 7.0 mm Number of attempts: 1 Airway Equipment and Method: Stylet Placement Confirmation: ETT inserted through vocal cords under direct vision,  breath sounds checked- equal and bilateral,  positive ETCO2 and CO2 detector Secured at: 21 cm Tube secured with: Tape Dental Injury: Teeth and Oropharynx as per pre-operative assessment

## 2015-10-12 NOTE — Op Note (Signed)
10/12/2015  3:50 PM  PATIENT:  Azalia Bilis  45 y.o. female  PRE-OPERATIVE DIAGNOSIS:  Abnormal Uterine Bleeding, Anemia, Symptomatic fibroids  POST-OPERATIVE DIAGNOSIS:  Abnormal Uterine Bleeding, Anemia  PROCEDURE:  Procedure(s): ROBOTIC ASSISTED TOTAL LS HYSTERECTOMY  BILATERAL SALPINGECTOMY LYSIS OF OMENTAL TO ABDOMINAL WALL ADHESIONS MCCALL CUL DE PLASTY  SURGEON:  Surgeon(s): Brien Few, MD  ASSISTANTS: Mel Almond, FACM, CNM   ANESTHESIA:   local and general  ESTIMATED BLOOD LOSS: * No blood loss amount entered *   DRAINS: Urinary Catheter (Foley)   LOCAL MEDICATIONS USED:  MARCAINE    and Amount: 30 ml  SPECIMEN:  Source of Specimen:  UTERUS, CERVIX AND TUBES  DISPOSITION OF SPECIMEN:  PATHOLOGY  COUNTS:  YES  DICTATION #: [id number]  PLAN OF CARE: 23 HR OBSERVATION  PATIENT DISPOSITION:  PACU - hemodynamically stable.

## 2015-10-12 NOTE — Transfer of Care (Signed)
Immediate Anesthesia Transfer of Care Note  Patient: [name]  Procedure(s) Performed: Procedure(s): ROBOTIC ASSISTED TOTAL HYSTERECTOMY WITH SALPINGECTOMY (Bilateral)  Patient Location: PACU  Anesthesia Type:General  Level of Consciousness: awake, alert  and oriented  Airway & Oxygen Therapy: Patient Spontanous Breathing and Patient connected to nasal cannula oxygen  Post-op Assessment: Report given to RN and Post -op Vital signs reviewed and stable  Post vital signs: Reviewed and stable  Last Vitals:  Filed Vitals:   10/12/15 1121  BP: 148/83  Pulse: 103  Temp: 36.8 C  Resp: 20    Last Pain: There were no vitals filed for this visit.       Complications: No apparent anesthesia complications

## 2015-10-13 ENCOUNTER — Encounter (HOSPITAL_COMMUNITY): Payer: Self-pay | Admitting: Obstetrics and Gynecology

## 2015-10-13 DIAGNOSIS — N920 Excessive and frequent menstruation with regular cycle: Secondary | ICD-10-CM | POA: Diagnosis not present

## 2015-10-13 LAB — CBC
HCT: 25.1 % — ABNORMAL LOW (ref 36.0–46.0)
Hemoglobin: 7.4 g/dL — ABNORMAL LOW (ref 12.0–15.0)
MCH: 18.3 pg — ABNORMAL LOW (ref 26.0–34.0)
MCHC: 29.5 g/dL — ABNORMAL LOW (ref 30.0–36.0)
MCV: 62.1 fL — AB (ref 78.0–100.0)
Platelets: 351 10*3/uL (ref 150–400)
RBC: 4.04 MIL/uL (ref 3.87–5.11)
RDW: 17.9 % — AB (ref 11.5–15.5)
WBC: 11.4 10*3/uL — AB (ref 4.0–10.5)

## 2015-10-13 LAB — BASIC METABOLIC PANEL
ANION GAP: 7 (ref 5–15)
BUN: 10 mg/dL (ref 6–20)
CALCIUM: 8.8 mg/dL — AB (ref 8.9–10.3)
CO2: 24 mmol/L (ref 22–32)
Chloride: 103 mmol/L (ref 101–111)
Creatinine, Ser: 0.61 mg/dL (ref 0.44–1.00)
Glucose, Bld: 129 mg/dL — ABNORMAL HIGH (ref 65–99)
POTASSIUM: 4.1 mmol/L (ref 3.5–5.1)
SODIUM: 134 mmol/L — AB (ref 135–145)

## 2015-10-13 MED ORDER — OXYCODONE-ACETAMINOPHEN 5-325 MG PO TABS: 1.0000 | ORAL_TABLET | ORAL | Status: DC | PRN

## 2015-10-13 MED ORDER — TRAMADOL HCL 50 MG PO TABS: 50.0000 mg | ORAL_TABLET | Freq: Four times a day (QID) | ORAL | Status: DC | PRN

## 2015-10-13 MED ORDER — ONDANSETRON 4 MG PO TBDP
4.0000 mg | ORAL_TABLET | Freq: Once | ORAL | Status: AC
  Administered 2015-10-13: 4 mg via ORAL
  Filled 2015-10-13: qty 1

## 2015-10-13 NOTE — Progress Notes (Signed)
Pt had an episode of  nuasea and vomitting when RN tried to get her out of bed to walk. Had about 50 cc. Rn will go back and walk pt when she feels much better.

## 2015-10-13 NOTE — Progress Notes (Signed)
I spent some significant time with [facility] as she processed some of her emotions, particularly those related to family relationships and being at home as she recovers from surgery.    I offered prayer as well as a prayer shawl.  I also gave her my card for follow-up short term support and  brought her information about ongoing counseling as well.  [place], Bcc Pager, [phone] 1:24 PM

## 2015-10-13 NOTE — Progress Notes (Signed)
1 Day Post-Op Procedure(s) (LRB): ROBOTIC ASSISTED TOTAL HYSTERECTOMY WITH SALPINGECTOMY (Bilateral)  Subjective: Patient reports nausea, incisional pain, tolerating PO, + flatus and no problems voiding.    Objective: I have reviewed patient's vital signs, intake and output, medications and labs.  General: alert, cooperative and appears stated age Resp: clear to auscultation bilaterally and normal percussion bilaterally Cardio: regular rate and rhythm, S1, S2 normal, no murmur, click, rub or gallop and normal apical impulse GI: soft, non-tender; bowel sounds normal; no masses,  no organomegaly and incision: clean, dry and intact Extremities: extremities normal, atraumatic, no cyanosis or edema and Homans sign is negative, no sign of DVT Vaginal Bleeding: minimal  Assessment: s/p Procedure(s): ROBOTIC ASSISTED TOTAL HYSTERECTOMY WITH SALPINGECTOMY (Bilateral): stable, progressing well, tolerating diet and anemia  Plan: Advance diet Encourage ambulation Advance to PO medication Discontinue IV fluids Discharge home     Casmer Yepiz J 10/13/2015, 6:38 AM

## 2015-10-13 NOTE — Progress Notes (Signed)
Pt vomited approximately 50 ml yellow emesis with mucous and small amount of undigested food..  Feels nauseated and dizzy. Requesting Zofran for nausea.  States she is passing flatus, but has not had bowel movement.  Abdomen soft, slightly distended.  Bowel sounds present.  Phoned Dr. Ronita Hipps with above information.  See order for Zofran.  He stated that after Zofran dose, pt could be discharged as ordered this morning.

## 2015-10-13 NOTE — Addendum Note (Signed)
Addendum  created 10/13/15 0801 by Rayvon Char, CRNA   Modules edited: Clinical Notes   Clinical Notes:  File: [id number]

## 2015-10-13 NOTE — Anesthesia Postprocedure Evaluation (Signed)
Anesthesia Post Note  Patient: [name]  Procedure(s) Performed: Procedure(s) (LRB): ROBOTIC ASSISTED TOTAL HYSTERECTOMY WITH SALPINGECTOMY (Bilateral)  Patient location during evaluation: [facility] Anesthesia Type: General Level of consciousness: awake and alert Pain management: pain level controlled Vital Signs Assessment: post-procedure vital signs reviewed and stable Respiratory status: spontaneous breathing, nonlabored ventilation, respiratory function stable and patient connected to nasal cannula oxygen Cardiovascular status: blood pressure returned to baseline and stable Postop Assessment: no signs of nausea or vomiting Anesthetic complications: no     Last Vitals:  Filed Vitals:   10/13/15 0203 10/13/15 0527  BP: 125/78 118/71  Pulse: 97 88  Temp: 36.8 C 37 C  Resp: 16 18    Last Pain:  Filed Vitals:   10/13/15 0539  PainSc: 3    Pain Goal: Patients Stated Pain Goal: 3 (10/13/15 0430)               Rayvon Char

## 2015-10-13 NOTE — Progress Notes (Signed)
Pt was up and ambulated in hallway. Tolerated very well. Foley d/c, awaiting for pt to void .

## 2015-10-13 NOTE — Progress Notes (Signed)
Teaching   Complete   Husband present   Ambulated out

## 2015-10-13 NOTE — Op Note (Signed)
[name], [name]              ACCOUNT NO.:  [id number]  MEDICAL RECORD NO.:  [id number]  LOCATION:  [id number]                          FACILITY:  [facility]  PHYSICIAN:  Lovenia Kim, M.D.DATE OF BIRTH:  [birth year]  DATE OF PROCEDURE: DATE OF DISCHARGE:                              OPERATIVE REPORT   PREOPERATIVE DIAGNOSES:  Symptomatic dysmenorrhea, menorrhagia, secondary anemia, and fibroids.  POSTOPERATIVE DIAGNOSES:  Symptomatic dysmenorrhea, menorrhagia, secondary anemia, fibroids plus enterocele and omental adhesions.  PROCEDURES:  Research officer, trade union assisted total laparoscopic hysterectomy, bilateral salpingectomy, lysis of sigmoid to left adnexal adhesions, lysis of omental to anterior abdominal wall adhesions, McCall culdoplasty.  SURGEON:  Lovenia Kim, M.D.  ASSISTANT:  Mel Almond.  ANESTHESIA:  General and local.  ESTIMATED BLOOD LOSS:  100 mL.  COMPLICATIONS:  None.  DRAINS:  Foley.  COUNTS:  Correct.  DISPOSITION:  The patient recovered in good condition.  BRIEF OPERATIVE NOTE:  After being apprised of risks of anesthesia, infection, bleeding, injury to surrounding organs, possible need for repair, delayed versus immediate complications to include bowel and bladder injury, possible need for repair, possible risk of failure, the patient was brought to the operating room where she was administered a general anesthetic, prepped in usual sterile fashion.  A Foley catheter was placed.  Exam under anesthesia revealed a 12-14 weeks size uterus. No adnexal masses.  Uterus was mobile.  At this time, a RUMI retractor was placed vaginally without difficulty, and a supraumbilical incision was made with a scalpel.  A Veress needle was placed with opening pressure of -2, 3 L of CO2 insufflated without difficulty.  Atraumatic trocar entry.  Pictures were taken.  Appendix not visualized. Gallbladder previously absent.  Omental adhesions to the anterior abdominal wall and sigmoid  adhesions to the left adnexa.  At this time, there were 5 mm and 8 mm ports that were made on the left, two 8 mm ports on the right.  Deep Trendelenburg position was established, and the robot was docked in a standard fashion using all four arms.  PK forceps, ProGrasp, and Endo Shears were placed.  Visualization on the left adnexa, lysis of omental to anterior abdominal wall adhesions were done sharply without difficulty.  Bowel adhesions were lysed from the left adnexa.  Retroperitoneal space was entered.  The mesosalpinx was dissected along the left and the infundibulopelvic ligament and the ovarian ligament were skeletonized.  The ovarian ligament was clamped and cut using bipolar cautery.  The posterior leaf of the broad ligament was developed, and the posterior leaf was dissected down to the level of the uterocervical junction.  At this time, the round ligament was divided on the left.  Bladder flap was developed sharply, and the uterine vessels were skeletonized on the left, cauterized using bipolar cautery but not divided.  The bladder flap was further developed sharply along the right adnexa.  The mesosalpinx was detached using monopolar cautery.  Retroperitoneal space was entered.  The infundibulopelvic ligament and ovarian ligament were skeletonized.  The ovarian ligaments were clamped and cut.  The posterior leaf of the broad ligament was developed sharply.  The round ligament was divided and opened after using bipolar cautery.  The bladder flap was further developed.  The right uterine vessels were skeletonized without difficulty.  Ureters were seen peristalsing bilaterally and were removed lateral to the area of dissection.  Bladder flap was completely dissected sharply off the RUMI cup, and the uterine vessels on the right were cauterized using bipolar cautery.  The uterine vessels in the left were cut.  The RUMI cup was identified, and the cervicovaginal junction was divided  using monopolar cautery.  The vagina was then closed using a 0 V-Loc suture in a continuous running fashion.  A second imbricating layer was placed. McCall culdoplasty suture was placed.  Good hemostasis was noted at this time, and the instruments were removed under direct visualization.  CO2 was released.  Positive pressure applied x3.  Good hemostasis had been assured.  All instruments were removed under direct visualization.  The incisions were closed using 0 Vicryl, 4-0 Vicryl, and Dermabond. Vaginal incision was inspected and found to be intact along the entire length of the incision.  Urine was clear.  At the end of the procedure, please note that the ureters were seen normal and peristalsing bilaterally.  The patient tolerated the procedure well, was awakened, and transferred to Recovery in good condition.     Lovenia Kim, M.D.     RJT/MEDQ  D:  10/12/2015  T:  10/13/2015  Job:  [id number]

## 2015-11-04 ENCOUNTER — Ambulatory Visit (INDEPENDENT_AMBULATORY_CARE_PROVIDER_SITE_OTHER): Payer: BLUE CROSS/BLUE SHIELD | Admitting: Emergency Medicine

## 2015-11-04 VITALS — BP 130/80 | HR 98 | Temp 97.8°F | Resp 17 | Ht 62.0 in | Wt 194.0 lb

## 2015-11-04 DIAGNOSIS — K219 Gastro-esophageal reflux disease without esophagitis: Secondary | ICD-10-CM | POA: Diagnosis not present

## 2015-11-04 DIAGNOSIS — Z91048 Other nonmedicinal substance allergy status: Secondary | ICD-10-CM

## 2015-11-04 DIAGNOSIS — I10 Essential (primary) hypertension: Secondary | ICD-10-CM

## 2015-11-04 DIAGNOSIS — R05 Cough: Secondary | ICD-10-CM

## 2015-11-04 DIAGNOSIS — Z9109 Other allergy status, other than to drugs and biological substances: Secondary | ICD-10-CM

## 2015-11-04 DIAGNOSIS — R059 Cough, unspecified: Secondary | ICD-10-CM

## 2015-11-04 MED ORDER — EPINEPHRINE 0.3 MG/0.3ML IJ SOAJ: 0.3000 mg | Freq: Once | INTRAMUSCULAR | Status: DC

## 2015-11-04 MED ORDER — CLONIDINE HCL 0.1 MG PO TABS: 0.1000 mg | ORAL_TABLET | Freq: Two times a day (BID) | ORAL | Status: DC

## 2015-11-04 NOTE — Patient Instructions (Addendum)
To start clonidine 0.1 twice a day. Take Zantac 150 mg at bedtime. I have made you an appointment to see the ENT specialist to see if you have reflux irritation to your larynx. Do not take any of your other blood pressure medications right now. See me in one month.    IF you received an x-ray today, you will receive an invoice from [facility]. Please contact [facility] at [phone] with questions or concerns regarding your invoice.   IF you received labwork today, you will receive an invoice from [employer]. Please contact [facility] at [phone] with questions or concerns regarding your invoice.   Our billing staff will not be able to assist you with questions regarding bills from these companies.  You will be contacted with the lab results as soon as they are available. The fastest way to get your results is to activate your My Chart account. Instructions are located on the last page of this paperwork. If you have not heard from [place] regarding the results in 2 weeks, please contact this office.     Cough, Adult Coughing is a reflex that clears your throat and your airways. Coughing helps to heal and protect your lungs. It is normal to cough occasionally, but a cough that happens with other symptoms or lasts a long time may be a sign of a condition that needs treatment. A cough may last only 2-3 weeks (acute), or it may last longer than 8 weeks (chronic). CAUSES Coughing is commonly caused by:  Breathing in substances that irritate your lungs.  A viral or bacterial respiratory infection.  Allergies.  Asthma.  Postnasal drip.  Smoking.  Acid backing up from the stomach into the esophagus (gastroesophageal reflux).  Certain medicines.  Chronic lung problems, including COPD (or rarely, lung cancer).  Other medical conditions such as heart failure. HOME CARE INSTRUCTIONS  Pay attention to any changes in your symptoms. Take these  actions to help with your discomfort:  Take medicines only as told by your health care provider.  If you were prescribed an antibiotic medicine, take it as told by your health care provider. Do not stop taking the antibiotic even if you start to feel better.  Talk with your health care provider before you take a cough suppressant medicine.  Drink enough fluid to keep your urine clear or pale yellow.  If the air is dry, use a cold steam vaporizer or humidifier in your bedroom or your home to help loosen secretions.  Avoid anything that causes you to cough at work or at home.  If your cough is worse at night, try sleeping in a semi-upright position.  Avoid cigarette smoke. If you smoke, quit smoking. If you need help quitting, ask your health care provider.  Avoid caffeine.  Avoid alcohol.  Rest as needed. SEEK MEDICAL CARE IF:   You have new symptoms.  You cough up pus.  Your cough does not get better after 2-3 weeks, or your cough gets worse.  You cannot control your cough with suppressant medicines and you are losing sleep.  You develop pain that is getting worse or pain that is not controlled with pain medicines.  You have a fever.  You have unexplained weight loss.  You have night sweats. SEEK IMMEDIATE MEDICAL CARE IF:  You cough up blood.  You have difficulty breathing.  Your heartbeat is very fast.   This information is not intended to replace advice given to you by your health  care provider. Make sure you discuss any questions you have with your health care provider.   Document Released: 11/19/2010 Document Revised: 02/11/2015 Document Reviewed: 07/30/2014 Elsevier Interactive Patient Education [employer].

## 2015-11-04 NOTE — Progress Notes (Signed)
Patient ID: [name], female   DOB: 04-18-1971, 45 y.o.   MRN: [id number]    By signing my name below, I, [name], attest that this documentation has been prepared under the direction and in the presence of Darlyne Russian, MD Electronically Signed: Ladene Artist, ED Scribe 11/04/2015 at 2:04 PM.  Chief Complaint:  Chief Complaint  Patient presents with  . Medication Problem    coughing from medication norvasc   HPI: [name] is a 45 y.o. female who reports to [facility] today complaining of unchanged cough for several months. Pt states that cough keeps her up at night. At her last visit last month, pt switched from Lisinopril-HCTZ to Norvasc but is still experiencing a cough. Triage BP: 130/80; repeat BP: 138/86. She reports associated symptoms of dizziness, mild HA and a sensation that her throat is closing while swallowing. Pt has tried Zyrtec and ibuprofen with minimal relief. Pt also states that she stopped taking oxycodone, which she was prescribed for pain management following surgery, without significant relief. She reports h/o asthma but denies exacerbation in several years. Pt also denies acid reflux, heartburn, belching, wheezing, SOB. No h/o tobacco use.   Past Medical History  Diagnosis Date  . Heartburn   . Epigastric pain   . Gallstones   . Iron deficiency anemia   . Asthma     IN PAST NO RECENT PROBLEMS  . Blood clot in vein 2008    right leg  . Hypertension   . Anxiety   . Depression   . Headache(784.0)     Migraines   Past Surgical History  Procedure Laterality Date  . Tubal reversal    . Cholecystectomy  10/05/2011    Procedure: LAPAROSCOPIC CHOLECYSTECTOMY WITH INTRAOPERATIVE CHOLANGIOGRAM;  Surgeon: Pedro Earls, MD;  Location: [facility];  Service: General;  Laterality: N/A;  . Tubal ligation    . Colonoscopy    . Robotic assisted total hysterectomy with salpingectomy Bilateral 10/12/2015    Procedure: ROBOTIC ASSISTED TOTAL HYSTERECTOMY WITH  SALPINGECTOMY;  Surgeon: Brien Few, MD;  Location: [facility];  Service: Gynecology;  Laterality: Bilateral;   Social History   Social History  . Marital Status: Married    Spouse Name: N/A  . Number of Children: 3  . 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     Social History Main Topics  . Smoking status: Never Smoker   . Smokeless tobacco: Never Used  . Alcohol Use: Yes     Comment: RARE  . Drug Use: No  . Sexual Activity: Yes    Birth Control/ Protection: None   Other Topics Concern  . None   Social History Narrative   2 caffeine drinks daily   Patient does not get regular exercise.   Family History  Problem Relation Age of Onset  . Diabetes      Family History  . Colon cancer Neg Hx   . Hypertension Mother   . Diabetes Mother   . Cancer Father    Allergies  Allergen Reactions  . Ace Inhibitors Cough    She did have borderline swelling to the tip of her uvula.   Prior to Admission medications   Medication Sig Start Date End Date Taking? Authorizing Provider  acetaminophen (TYLENOL) 500 MG tablet Take 500 mg by mouth every 6 (six) hours as needed for mild pain or headache.    Yes Historical Provider, MD  amLODipine (NORVASC) 5 MG tablet Take 1 tablet (5 mg total) by mouth daily. 09/29/15  Yes Darlyne Russian, MD  ferrous sulfate 325 (65 FE) MG tablet Take 325 mg by mouth daily with breakfast.   Yes Historical Provider, MD  hydrochlorothiazide (MICROZIDE) 12.5 MG capsule Take 1 capsule (12.5 mg total) by mouth daily. 09/29/15  Yes Darlyne Russian, MD  HYDROcodone-homatropine [facility]) 5-1.5 MG/5ML syrup Take 5 mLs by mouth every 8 (eight) hours as needed for cough. 09/29/15  Yes Darlyne Russian, MD  ibuprofen (ADVIL,MOTRIN) 800 MG tablet Take 1 tablet (800 mg total) by mouth every 8 (eight) hours as needed for mild pain. 03/22/14  Yes Wandra Arthurs, MD  cetirizine (ZYRTEC) 10 MG tablet Take 10 mg by mouth daily. Reported on 11/04/2015    Historical  Provider, MD  meloxicam (MOBIC) 15 MG tablet Take 1 tablet (15 mg total) by mouth daily. Patient not taking: Reported on 11/04/2015 07/04/15   Leandrew Koyanagi, MD  oxyCODONE-acetaminophen (ROXICET) 5-325 MG tablet Take 1-2 tablets by mouth every 6 (six) hours as needed for severe pain. Patient not taking: Reported on 09/18/2015 07/04/15   Leandrew Koyanagi, MD  oxyCODONE-acetaminophen (ROXICET) 5-325 MG tablet Take 1-2 tablets by mouth every 3 (three) hours as needed for severe pain. Patient not taking: Reported on 11/04/2015 10/13/15   Brien Few, MD  traMADol (ULTRAM) 50 MG tablet Take 1 tablet (50 mg total) by mouth every 6 (six) hours as needed for moderate pain. Patient not taking: Reported on 11/04/2015 10/13/15   Brien Few, MD   ROS: The patient denies fevers, chills, night sweats, unintentional weight loss, chest pain, palpitations, wheezing, dyspnea on exertion, nausea, vomiting, abdominal pain, dysuria, hematuria, melena, numbness, weakness, or tingling.  All other systems have been reviewed and were otherwise negative with the exception of those mentioned in the HPI and as above.    PHYSICAL EXAM: Filed Vitals:   11/04/15 1326  BP: 130/80  Pulse: 98  Temp: 97.8 F (36.6 C)  Resp: 17   Body mass index is 35.47 kg/(m^2).  General: Alert, no acute distress HEENT:  Normocephalic, atraumatic, oropharynx patent. Mild swelling of the uvula. Neck supple.  Eye: [facility] Cardiovascular: Regular rate and rhythm, no rubs murmurs or gallops. No Carotid bruits, radial pulse intact. No pedal edema.  Respiratory: Clear to auscultation bilaterally. No wheezes, rales, or rhonchi. No cyanosis, no use of accessory musculature Abdominal: No organomegaly, abdomen is soft and non-tender, positive bowel sounds. No masses. Musculoskeletal: Gait intact. No edema, tenderness Skin: No rashes. Neurologic: Facial musculature symmetric. Psychiatric: Patient acts appropriately throughout our  interaction. Lymphatic: No cervical or submandibular lymphadenopathy Pulmonary function test done was completely normal LABS:  EKG/XRAY:   Primary read interpreted by Dr. Everlene Farrier at [facility].  ASSESSMENT/PLAN:  When I last saw the patient I thought her cough would be secondary to the Ace inhibitors but stopping the medication did not seem to make a difference. She is currently on Norvasc and HCTZ and is concerned that possibly one of these is causing some discomfort with swelling sensation. I decided to change her to clonidine 0.1 twice a day. We'll stop the Norvasc and stop the HCTZ. She will be on Zantac 150 at night. She will take Allegra during the day. She was given a prescription for EpiPen to use an emergency. Referral made to ENT to look at the back of her throat and be sure some of her throat symptoms are not reflux  related. She will return to clinic after evaluation by ENT. We will get a blood pressure check in. She will need a chest x-ray then. Pulmonary function test was completely normal.I personally performed the services described in this documentation, which was scribed in my presence. The recorded information has been reviewed and is accurate.   Gross sideeffects, risk and benefits, and alternatives of medications d/w patient. Patient is aware that all medications have potential sideeffects and we are unable to predict every sideeffect or drug-drug interaction that may occur.  Arlyss Queen MD 11/04/2015 1:48 PM

## 2015-11-18 ENCOUNTER — Other Ambulatory Visit (HOSPITAL_COMMUNITY): Payer: Self-pay | Admitting: Otolaryngology

## 2015-11-18 DIAGNOSIS — K219 Gastro-esophageal reflux disease without esophagitis: Secondary | ICD-10-CM

## 2015-11-19 ENCOUNTER — Other Ambulatory Visit (HOSPITAL_COMMUNITY): Payer: Self-pay | Admitting: Otolaryngology

## 2015-11-19 DIAGNOSIS — R1310 Dysphagia, unspecified: Secondary | ICD-10-CM

## 2015-11-24 ENCOUNTER — Ambulatory Visit (HOSPITAL_COMMUNITY): Payer: BLUE CROSS/BLUE SHIELD

## 2015-11-24 ENCOUNTER — Inpatient Hospital Stay (HOSPITAL_COMMUNITY): Admission: RE | Admit: 2015-11-24 | Payer: BLUE CROSS/BLUE SHIELD | Source: Ambulatory Visit

## 2015-11-24 ENCOUNTER — Ambulatory Visit (HOSPITAL_COMMUNITY): Admission: RE | Admit: 2015-11-24 | Payer: BLUE CROSS/BLUE SHIELD | Source: Ambulatory Visit

## 2016-04-16 ENCOUNTER — Other Ambulatory Visit: Payer: Self-pay | Admitting: Emergency Medicine

## 2016-04-16 DIAGNOSIS — I10 Essential (primary) hypertension: Secondary | ICD-10-CM

## 2016-04-17 ENCOUNTER — Other Ambulatory Visit: Payer: Self-pay | Admitting: Physician Assistant

## 2016-04-17 DIAGNOSIS — M25561 Pain in right knee: Secondary | ICD-10-CM

## 2016-05-09 ENCOUNTER — Encounter (HOSPITAL_COMMUNITY): Payer: Self-pay | Admitting: Emergency Medicine

## 2016-05-09 ENCOUNTER — Emergency Department (HOSPITAL_COMMUNITY)
Admission: EM | Admit: 2016-05-09 | Discharge: 2016-05-10 | Disposition: A | Discharge status: Home / Self Care | Payer: BLUE CROSS/BLUE SHIELD | Attending: Emergency Medicine | Admitting: Emergency Medicine

## 2016-05-09 DIAGNOSIS — Z79899 Other long term (current) drug therapy: Secondary | ICD-10-CM | POA: Insufficient documentation

## 2016-05-09 DIAGNOSIS — R51 Headache: Secondary | ICD-10-CM | POA: Insufficient documentation

## 2016-05-09 DIAGNOSIS — I10 Essential (primary) hypertension: Secondary | ICD-10-CM | POA: Insufficient documentation

## 2016-05-09 DIAGNOSIS — R519 Headache, unspecified: Secondary | ICD-10-CM

## 2016-05-09 DIAGNOSIS — J45909 Unspecified asthma, uncomplicated: Secondary | ICD-10-CM | POA: Diagnosis not present

## 2016-05-09 LAB — CBC
HEMATOCRIT: 34.1 % — AB (ref 36.0–46.0)
HEMOGLOBIN: 10.9 g/dL — AB (ref 12.0–15.0)
MCH: 21.8 pg — ABNORMAL LOW (ref 26.0–34.0)
MCHC: 32 g/dL (ref 30.0–36.0)
MCV: 68.1 fL — ABNORMAL LOW (ref 78.0–100.0)
Platelets: 283 10*3/uL (ref 150–400)
RBC: 5.01 MIL/uL (ref 3.87–5.11)
RDW: 18.7 % — AB (ref 11.5–15.5)
WBC: 4.9 10*3/uL (ref 4.0–10.5)

## 2016-05-09 LAB — I-STAT CHEM 8, ED
BUN: 9 mg/dL (ref 6–20)
Calcium, Ion: 1.18 mmol/L (ref 1.15–1.40)
Chloride: 107 mmol/L (ref 101–111)
Creatinine, Ser: 0.7 mg/dL (ref 0.44–1.00)
Glucose, Bld: 110 mg/dL — ABNORMAL HIGH (ref 65–99)
HEMATOCRIT: 35 % — AB (ref 36.0–46.0)
HEMOGLOBIN: 11.9 g/dL — AB (ref 12.0–15.0)
POTASSIUM: 3.9 mmol/L (ref 3.5–5.1)
SODIUM: 140 mmol/L (ref 135–145)
TCO2: 22 mmol/L (ref 0–100)

## 2016-05-09 MED ORDER — SODIUM CHLORIDE 0.9 % IV SOLN
Freq: Once | INTRAVENOUS | Status: AC
  Administered 2016-05-09: via INTRAVENOUS

## 2016-05-09 MED ORDER — METOCLOPRAMIDE HCL 5 MG/ML IJ SOLN
10.0000 mg | INTRAMUSCULAR | Status: AC
  Administered 2016-05-09: 10 mg via INTRAVENOUS
  Filled 2016-05-09: qty 2

## 2016-05-09 NOTE — ED Triage Notes (Signed)
Pt states she had a migraine headache earlier today so she took 2 400mg  tablets every hour from 3 or 4 til about 9 or so  For a total of 10 to 12 tablets  Pt states she was trying to get rid of her headache  Denies SI

## 2016-05-10 LAB — ACETAMINOPHEN LEVEL

## 2016-05-10 LAB — SALICYLATE LEVEL

## 2016-05-10 MED ORDER — BUTALBITAL-APAP-CAFFEINE 50-325-40 MG PO TABS: 1.0000 | ORAL_TABLET | Freq: Three times a day (TID) | ORAL | 0 refills | Status: DC | PRN

## 2016-05-10 MED ORDER — DIPHENHYDRAMINE HCL 50 MG/ML IJ SOLN
12.5000 mg | Freq: Once | INTRAMUSCULAR | Status: AC
  Administered 2016-05-10: 12.5 mg via INTRAVENOUS
  Filled 2016-05-10: qty 1

## 2016-05-10 MED ORDER — PROCHLORPERAZINE EDISYLATE 5 MG/ML IJ SOLN
10.0000 mg | Freq: Once | INTRAMUSCULAR | Status: AC
  Administered 2016-05-10: 10 mg via INTRAVENOUS
  Filled 2016-05-10: qty 2

## 2016-05-10 NOTE — ED Provider Notes (Signed)
[facility] DEPT Provider Note   CSN: [id number] Arrival date & time: 05/09/16  2214     History   Chief Complaint Chief Complaint  Patient presents with  . Ingestion    HPI [name] is a 45 y.o. female.  45 year old female percent's to the emergency department for evaluation of headache. Patient complains of a constant, throbbing pain in her frontal forehead and temples which began at approximately 1500 today. Pain was gradual in onset and did not respond to ibuprofen. Patient has had some photophobia without vision changes or vision loss. [name] complains of some mild nausea, but has had no vomiting. No recent fevers, extremity numbness, weakness, facial drooping, head injury or trauma. [name] denies a history of similar headaches; however, [name] feels as though the onset was caused to her restarting phentermine for weight loss.   The history is provided by the patient. No language interpreter was used.  Ingestion     Past Medical History:  Diagnosis Date  . Anxiety   . Asthma    IN PAST NO RECENT PROBLEMS  . Blood clot in vein 2008   right leg  . Depression   . Epigastric pain   . Gallstones   . Headache(784.0)    Migraines  . Heartburn   . Hypertension   . Iron deficiency anemia     Patient Active Problem List   Diagnosis Date Noted  . Environmental allergies 11/04/2015  . Fibroids 10/12/2015  . Essential hypertension 03/19/2015  . Chronic cholecystitis 09/08/2011  . IRON DEFICIENCY 10/22/2009  . ABDOMINAL PAIN, LEFT UPPER QUADRANT 07/22/2009  . ESOPHAGEAL REFLUX 04/17/2009    Past Surgical History:  Procedure Laterality Date  . CHOLECYSTECTOMY  10/05/2011   Procedure: LAPAROSCOPIC CHOLECYSTECTOMY WITH INTRAOPERATIVE CHOLANGIOGRAM;  Surgeon: Pedro Earls, MD;  Location: [facility];  Service: General;  Laterality: N/A;  . COLONOSCOPY    . ROBOTIC ASSISTED TOTAL HYSTERECTOMY WITH SALPINGECTOMY Bilateral 10/12/2015   Procedure: ROBOTIC ASSISTED TOTAL  HYSTERECTOMY WITH SALPINGECTOMY;  Surgeon: Brien Few, MD;  Location: [facility];  Service: Gynecology;  Laterality: Bilateral;  . TUBAL LIGATION    . TUBAL REVERSAL      OB History    No data available       Home Medications    Prior to Admission medications   Medication Sig Start Date End Date Taking? Authorizing Provider  cetirizine (ZYRTEC) 10 MG tablet Take 10 mg by mouth daily as needed for allergies. Reported on 11/04/2015   Yes Historical Provider, MD  cloNIDine (CATAPRES) 0.1 MG tablet TAKE 1 TABLET BY MOUTH TWICE A DAY 04/18/16  Yes Jaynee Eagles, PA-C  EPINEPHrine 0.3 mg/0.3 mL IJ SOAJ injection Inject 0.3 mLs (0.3 mg total) into the muscle once. 11/04/15  Yes Darlyne Russian, MD  ferrous sulfate 325 (65 FE) MG tablet Take 325 mg by mouth daily with breakfast.   Yes Historical Provider, MD  ibuprofen (ADVIL,MOTRIN) 800 MG tablet Take 1 tablet (800 mg total) by mouth every 8 (eight) hours as needed for mild pain. 03/22/14  Yes Drenda Freeze, MD  phentermine 37.5 MG capsule Take 37.5 mg by mouth every morning.   Yes Historical Provider, MD  butalbital-acetaminophen-caffeine (FIORICET, ESGIC) 50-325-40 MG tablet Take 1-2 tablets by mouth every 8 (eight) hours as needed for headache. 05/10/16 05/10/17  Antonietta Breach, PA-C  HYDROcodone-homatropine (HYCODAN) 5-1.5 MG/5ML syrup Take 5 mLs by mouth every 8 (eight) hours as needed for cough. Patient not taking: Reported on 05/09/2016 09/29/15  Darlyne Russian, MD  meloxicam (MOBIC) 15 MG tablet Take 1 tablet (15 mg total) by mouth daily. Patient not taking: Reported on 11/04/2015 07/04/15   Leandrew Koyanagi, MD  oxyCODONE-acetaminophen (ROXICET) 5-325 MG tablet Take 1-2 tablets by mouth every 6 (six) hours as needed for severe pain. Patient not taking: Reported on 09/18/2015 07/04/15   Leandrew Koyanagi, MD  oxyCODONE-acetaminophen (ROXICET) 5-325 MG tablet Take 1-2 tablets by mouth every 3 (three) hours as needed for severe pain. Patient not  taking: Reported on 11/04/2015 10/13/15   Brien Few, MD  traMADol (ULTRAM) 50 MG tablet Take 1 tablet (50 mg total) by mouth every 6 (six) hours as needed for moderate pain. Patient not taking: Reported on 11/04/2015 10/13/15   Brien Few, MD    Family History Family History  Problem Relation Age of Onset  . Hypertension Mother   . Diabetes Mother   . Cancer Father   . Diabetes      Family History  . Colon cancer Neg Hx     Social History Social History  Substance Use Topics  . Smoking status: Never Smoker  . Smokeless tobacco: Never Used  . Alcohol use Yes     Comment: RARE     Allergies   Ace inhibitors   Review of Systems Review of Systems Ten systems reviewed and are negative for acute change, except as noted in the HPI.    Physical Exam Updated Vital Signs BP 124/73 (BP Location: Right Arm)   Pulse 82   Temp 98.2 F (36.8 C) (Oral)   Resp 16   SpO2 99%   Physical Exam  Constitutional: [name] is oriented to person, place, and time. [name] appears well-developed and well-nourished. No distress.  Nontoxic and in no distress  HENT:  Head: Normocephalic and atraumatic.  Mouth/Throat: Oropharynx is clear and moist.  Eyes: Conjunctivae and EOM are normal. Pupils are equal, round, and reactive to light. No scleral icterus.  Neck: Normal range of motion.  No nuchal rigidity or meningismus  Cardiovascular: Normal rate, regular rhythm and intact distal pulses.   Pulmonary/Chest: Effort normal. No respiratory distress. [name] has no wheezes.  Respirations even and unlabored  Musculoskeletal: Normal range of motion.  Neurological: [name] is alert and oriented to person, place, and time. No cranial nerve deficit. [name] exhibits normal muscle tone. Coordination normal.  GCS 15. Speech is goal oriented. No cranial nerve deficits appreciated; symmetric eyebrow raise, no facial drooping, tongue midline. Patient has equal grip strength bilaterally with 5/5 strength against resistance  in all major muscle groups bilaterally. Sensation to light touch intact. Patient moves extremities without ataxia. Patient ambulatory with steady gait.  Skin: Skin is warm and dry. No rash noted. [name] is not diaphoretic. No erythema. No pallor.  Psychiatric: [name] has a normal mood and affect. Her behavior is normal.  Nursing note and vitals reviewed.    ED Treatments / Results  Labs (all labs ordered are listed, but only abnormal results are displayed) Labs Reviewed  ACETAMINOPHEN LEVEL - Abnormal; Notable for the following:       Result Value   Acetaminophen (Tylenol), Serum <10 (*)    All other components within normal limits  CBC - Abnormal; Notable for the following:    Hemoglobin 10.9 (*)    HCT 34.1 (*)    MCV 68.1 (*)    MCH 21.8 (*)    RDW 18.7 (*)    All other components within normal limits  I-STAT CHEM  8, ED - Abnormal; Notable for the following:    Glucose, Bld 110 (*)    Hemoglobin 11.9 (*)    HCT 35.0 (*)    All other components within normal limits  SALICYLATE LEVEL    EKG  EKG Interpretation None       Radiology No results found.  Procedures Procedures (including critical care time)  Medications Ordered in ED Medications  metoCLOPramide (REGLAN) injection 10 mg (10 mg Intravenous Given 05/09/16 2350)  0.9 %  sodium chloride infusion ( Intravenous New Bag/Given 05/09/16 2350)  prochlorperazine (COMPAZINE) injection 10 mg (10 mg Intravenous Given 05/10/16 0035)  diphenhydrAMINE (BENADRYL) injection 12.5 mg (12.5 mg Intravenous Given 05/10/16 0039)     Initial Impression / Assessment and Plan / ED Course  I have reviewed the triage vital signs and the nursing notes.  Pertinent labs & imaging results that were available during my care of the patient were reviewed by me and considered in my medical decision making (see chart for details).  Clinical Course     45 year old female presents to the emergency department for headache. [name] had no improvement  with ibuprofen prior to arrival. Over the course of the day, patient has not taken more than 2000 mg ibuprofen in a 24-hour period. This is not a toxic or lethal dose of this medication. Patient with reassuring, nonfocal neurologic exam. [name] has no fever, nuchal rigidity, or meningismus to suggest meningitis. On repeat assessment, patient reports complete resolution of her headache following Compazine and Benadryl. [name] was previously also given Reglan and fluids. Low suspicion for emergent cause of headache today. Patient advised to discuss with her primary care doctor whether to continue phentermine. Return precautions discussed and provided. Patient discharged in stable condition with no unaddressed concerns.   Final Clinical Impressions(s) / ED Diagnoses   Final diagnoses:  Bad headache    New Prescriptions New Prescriptions   BUTALBITAL-ACETAMINOPHEN-CAFFEINE (FIORICET, ESGIC) 50-325-40 MG TABLET    Take 1-2 tablets by mouth every 8 (eight) hours as needed for headache.     Antonietta Breach, PA-C 05/10/16 [id number]    Quintella Reichert, MD 05/10/16 [phone]

## 2016-05-29 ENCOUNTER — Other Ambulatory Visit: Payer: Self-pay | Admitting: Urgent Care

## 2016-05-29 DIAGNOSIS — I10 Essential (primary) hypertension: Secondary | ICD-10-CM

## 2016-06-27 ENCOUNTER — Other Ambulatory Visit: Payer: Self-pay | Admitting: Physician Assistant

## 2016-06-27 DIAGNOSIS — I10 Essential (primary) hypertension: Secondary | ICD-10-CM

## 2016-07-04 ENCOUNTER — Other Ambulatory Visit: Payer: Self-pay | Admitting: Physician Assistant

## 2016-07-04 DIAGNOSIS — I10 Essential (primary) hypertension: Secondary | ICD-10-CM

## 2016-08-24 ENCOUNTER — Other Ambulatory Visit: Payer: Self-pay | Admitting: Physician Assistant

## 2016-08-24 DIAGNOSIS — I10 Essential (primary) hypertension: Secondary | ICD-10-CM

## 2016-09-29 ENCOUNTER — Other Ambulatory Visit: Payer: Self-pay | Admitting: Physician Assistant

## 2016-09-29 DIAGNOSIS — I10 Essential (primary) hypertension: Secondary | ICD-10-CM

## 2016-10-24 ENCOUNTER — Other Ambulatory Visit: Payer: Self-pay | Admitting: Physician Assistant

## 2016-10-24 DIAGNOSIS — I10 Essential (primary) hypertension: Secondary | ICD-10-CM

## 2016-11-07 ENCOUNTER — Other Ambulatory Visit: Payer: Self-pay | Admitting: Physician Assistant

## 2016-11-07 DIAGNOSIS — I10 Essential (primary) hypertension: Secondary | ICD-10-CM

## 2016-11-08 NOTE — Telephone Encounter (Signed)
Last seen 10/2015 by daub

## 2016-12-08 ENCOUNTER — Other Ambulatory Visit: Payer: Self-pay | Admitting: Physician Assistant

## 2016-12-08 DIAGNOSIS — I10 Essential (primary) hypertension: Secondary | ICD-10-CM

## 2016-12-26 ENCOUNTER — Ambulatory Visit: Payer: BLUE CROSS/BLUE SHIELD | Admitting: Physician Assistant

## 2016-12-27 ENCOUNTER — Ambulatory Visit (INDEPENDENT_AMBULATORY_CARE_PROVIDER_SITE_OTHER): Payer: BLUE CROSS/BLUE SHIELD | Admitting: Family Medicine

## 2016-12-27 ENCOUNTER — Encounter: Payer: Self-pay | Admitting: Family Medicine

## 2016-12-27 VITALS — BP 149/91 | HR 78 | Temp 98.6°F | Resp 16 | Ht 61.0 in | Wt 206.0 lb

## 2016-12-27 DIAGNOSIS — R739 Hyperglycemia, unspecified: Secondary | ICD-10-CM | POA: Diagnosis not present

## 2016-12-27 DIAGNOSIS — E669 Obesity, unspecified: Secondary | ICD-10-CM

## 2016-12-27 DIAGNOSIS — M25561 Pain in right knee: Secondary | ICD-10-CM

## 2016-12-27 DIAGNOSIS — M1711 Unilateral primary osteoarthritis, right knee: Secondary | ICD-10-CM

## 2016-12-27 DIAGNOSIS — I10 Essential (primary) hypertension: Secondary | ICD-10-CM

## 2016-12-27 DIAGNOSIS — Z6838 Body mass index (BMI) 38.0-38.9, adult: Secondary | ICD-10-CM

## 2016-12-27 MED ORDER — HYDROCHLOROTHIAZIDE 12.5 MG PO TABS: 12.5000 mg | ORAL_TABLET | Freq: Every day | ORAL | 1 refills | Status: DC

## 2016-12-27 MED ORDER — CLONIDINE HCL 0.1 MG PO TABS: 0.1000 mg | ORAL_TABLET | Freq: Two times a day (BID) | ORAL | 1 refills | Status: DC

## 2016-12-27 MED ORDER — DICLOFENAC SODIUM 75 MG PO TBEC: 75.0000 mg | DELAYED_RELEASE_TABLET | Freq: Two times a day (BID) | ORAL | 0 refills | Status: DC

## 2016-12-27 MED ORDER — DICLOFENAC SODIUM 1 % TD GEL: 4.0000 g | Freq: Four times a day (QID) | TRANSDERMAL | 0 refills | Status: DC

## 2016-12-27 NOTE — Patient Instructions (Addendum)
Start low-dose hydrochlorothiazide, can increase to one full pill in next week. Continue clonidine the same dose. Keep a record of your blood pressures outside of the office and bring them to the next office visit.  For knee pain and swelling, try tylenol first, but if needed can try the topical anti-inflammatory   I placed a referral to Dr. Leafy Ro to discuss weight loss medications. Her number is [phone].   Follow-up with me in next few weeks to review blood pressure control as well as discuss other options for your knee. As we discussed orthopedic referral can be placed at that time if needed.  Return to the clinic or go to the nearest emergency room if any of your symptoms worsen or new symptoms occur.    IF you received an x-ray today, you will receive an invoice from [facility]. Please contact [facility] Radiology at [phone] with questions or concerns regarding your invoice.   IF you received labwork today, you will receive an invoice from [facility]. Please contact [facility] at [phone] with questions or concerns regarding your invoice.   Our billing staff will not be able to assist you with questions regarding bills from these companies.  You will be contacted with the lab results as soon as they are available. The fastest way to get your results is to activate your My Chart account. Instructions are located on the last page of this paperwork. If you have not heard from [place] regarding the results in 2 weeks, please contact this office.

## 2016-12-27 NOTE — Progress Notes (Signed)
Subjective:  By signing my name below, I, [name], attest that this documentation has been prepared under the direction and in the presence of Wendie Agreste, MD Electronically Signed: Ladene Artist, ED Scribe 12/27/2016 at 1:48 PM.   Patient ID: [name], female    DOB: [birth year], 46 y.o.   MRN: [id number]  Chief Complaint  Patient presents with  . Hypertension    believes she needs increase on her BP meds  . Medication Refill    would like refill on phentermine   HPI [name] is a 46 y.o. female who presents to Primary Care at [facility].  HTN Takes clonidine 0.1 mg bid. Lab Results  Component Value Date   CREATININE 0.70 05/09/2016   Last seen here in May 2017. Was seen in the ER Dec 4 for HA. BP was normal at that time. Previous note for HTN 09/2015 by Dr. Everlene Farrier was reviewed. Seen for cough at that time. Lisinopril had been stopped, started on HCTZ 12.5 mg and amlodipine 5 mg per day with plan for recheck in 2 weeks. She was changed from those medications to clonidine in May of last year due to possible concern of swelling and sensation in her throat. Referred to ENT to evaluate other causes of cough. ENT note reviewed from 11/16/15.   Pt states cough improved when she stopped Lisinopril. States ENT thought swelling and sensation in her throat was due to allergies and maybe irritation from amlodipine. She denies postnasal drip/drainage, cough, irritation at this time. Pt has been checking her BP at home with average reading of 148/89. She has been compliant with BP medications, has had about 8 lb weight gain. Reports HAs but denies chest pain, sob, blood in stools, melena. She has also been taking Phentermine prescribed by Dr. Edwyna Ready for weight loss; states she has ~10 tablets left.   Right Knee Pain Pt reports right knee pain and swelling for over a year. She was seen in January 2017 with degenerative change in right knee without acute abnormalities. Pt has been  taking ibuprofen and tylenol but states ibuprofen seems to temporarily improve swelling. Pt has never seen ortho for knee pain.  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   Patient Active Problem List   Diagnosis Date Noted  . Environmental allergies 11/04/2015  . Fibroids 10/12/2015  . Essential hypertension 03/19/2015  . Chronic cholecystitis 09/08/2011  . IRON DEFICIENCY 10/22/2009  . ABDOMINAL PAIN, LEFT UPPER QUADRANT 07/22/2009  . ESOPHAGEAL REFLUX 04/17/2009   Past Medical History:  Diagnosis Date  . Anxiety   . Asthma    IN PAST NO RECENT PROBLEMS  . Blood clot in vein 2008   right leg  . Depression   . Epigastric pain   . Gallstones   . Headache(784.0)    Migraines  . Heartburn   . Hypertension   . Iron deficiency anemia    Past Surgical History:  Procedure Laterality Date  . CHOLECYSTECTOMY  10/05/2011   Procedure: LAPAROSCOPIC CHOLECYSTECTOMY WITH INTRAOPERATIVE CHOLANGIOGRAM;  Surgeon: Pedro Earls, MD;  Location: [facility];  Service: General;  Laterality: N/A;  . COLONOSCOPY    . ROBOTIC ASSISTED TOTAL HYSTERECTOMY WITH SALPINGECTOMY Bilateral 10/12/2015   Procedure: ROBOTIC ASSISTED TOTAL HYSTERECTOMY WITH SALPINGECTOMY;  Surgeon: Brien Few, MD;  Location: [facility];  Service: Gynecology;  Laterality: Bilateral;  . TUBAL LIGATION    . TUBAL REVERSAL     Allergies  Allergen Reactions  . Ace  Inhibitors Cough    She did have borderline swelling to the tip of her uvula.   Prior to Admission medications   Medication Sig Start Date End Date Taking? Authorizing Provider  cloNIDine (CATAPRES) 0.1 MG tablet TAKE 1 TABLET BY MOUTH TWICE DAILY 10/25/16  Yes Tereasa Coop, PA-C  EPINEPHrine 0.3 mg/0.3 mL IJ SOAJ injection Inject 0.3 mLs (0.3 mg total) into the muscle once. 11/04/15  Yes Darlyne Russian, MD  ferrous sulfate 325 (65 FE) MG tablet Take 325 mg by mouth daily with breakfast.   Yes [provider]  ibuprofen (ADVIL,MOTRIN) 800 MG tablet Take  1 tablet (800 mg total) by mouth every 8 (eight) hours as needed for mild pain. 03/22/14  Yes Drenda Freeze, MD  phentermine 37.5 MG capsule Take 37.5 mg by mouth every morning.   Yes [provider]  butalbital-acetaminophen-caffeine (FIORICET, ESGIC) 50-325-40 MG tablet Take 1-2 tablets by mouth every 8 (eight) hours as needed for headache. Patient not taking: Reported on 12/27/2016 05/10/16 05/10/17  Antonietta Breach, PA-C  cetirizine (ZYRTEC) 10 MG tablet Take 10 mg by mouth daily as needed for allergies. Reported on 11/04/2015    [provider]  HYDROcodone-homatropine (HYCODAN) 5-1.5 MG/5ML syrup Take 5 mLs by mouth every 8 (eight) hours as needed for cough. Patient not taking: Reported on 05/09/2016 09/29/15   Darlyne Russian, MD  meloxicam (MOBIC) 15 MG tablet Take 1 tablet (15 mg total) by mouth daily. Patient not taking: Reported on 11/04/2015 07/04/15   Leandrew Koyanagi, MD  oxyCODONE-acetaminophen (ROXICET) 5-325 MG tablet Take 1-2 tablets by mouth every 6 (six) hours as needed for severe pain. Patient not taking: Reported on 09/18/2015 07/04/15   Leandrew Koyanagi, MD  oxyCODONE-acetaminophen (ROXICET) 5-325 MG tablet Take 1-2 tablets by mouth every 3 (three) hours as needed for severe pain. Patient not taking: Reported on 11/04/2015 10/13/15   Brien Few, MD  traMADol (ULTRAM) 50 MG tablet Take 1 tablet (50 mg total) by mouth every 6 (six) hours as needed for moderate pain. Patient not taking: Reported on 11/04/2015 10/13/15   Brien Few, MD   Social History   Social History  . Marital status: Married    Spouse name: N/A  . Number of children: 3  . Years of education: N/A   Occupational History  . 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  [employer]   Social History Main Topics  . Smoking status: Never Smoker  . Smokeless tobacco: Never Used  . Alcohol use Yes     Comment: RARE  . Drug use: No  . Sexual activity: Yes    Birth control/ protection: None   Other  Topics Concern  . Not on file   Social History Narrative   2 caffeine drinks daily   Patient does not get regular exercise.   Review of Systems  Constitutional: Negative for fatigue and unexpected weight change.  HENT: Negative for postnasal drip.   Respiratory: Negative for cough, chest tightness and shortness of breath.   Cardiovascular: Negative for chest pain, palpitations and leg swelling.  Gastrointestinal: Negative for abdominal pain and blood in stool.  Musculoskeletal: Positive for arthralgias and joint swelling.  Neurological: Positive for headaches. Negative for dizziness, syncope and light-headedness.      Objective:   Physical Exam  Constitutional: She is oriented to person, place, and time. She appears well-developed and well-nourished.  HENT:  Head: Normocephalic and atraumatic.  Eyes: Pupils are equal,  round, and reactive to light. Conjunctivae and EOM are normal.  Neck: Carotid bruit is not present.  Cardiovascular: Normal rate, regular rhythm, normal heart sounds and intact distal pulses.   Pulmonary/Chest: Effort normal and breath sounds normal.  Abdominal: Soft. She exhibits no pulsatile midline mass. There is no tenderness.  Musculoskeletal:  Slight effusion to R knee. No appreciable joint line tenderness.  Neurological: She is alert and oriented to person, place, and time.  Skin: Skin is warm and dry.  Psychiatric: She has a normal mood and affect. Her behavior is normal.  Vitals reviewed.  Vitals:   12/27/16 1315  BP: (!) 149/91  Pulse: 78  Resp: 16  Temp: 98.6 F (37 C)  TempSrc: Oral  SpO2: 98%  Weight: 206 lb (93.4 kg)  Height: 5\' 1"  (1.549 m)  Body mass index is 38.92 kg/m.     Assessment & Plan:   [name] is a 46 y.o. female Hyperglycemia - Plan: Hemoglobin A1c  - Noted on prior labs, check A1c.  Essential hypertension - Plan: cloNIDine (CATAPRES) 0.1 MG tablet, hydrochlorothiazide (HYDRODIURIL) 12.5 MG tablet, Basic  metabolic panel  - Decreased control, some of which may be due to a cane. Discussed other options besides clonidine for blood pressure control. We'll avoid ACE inhibitor as due to possible previous cough/ACE inhibitor allergy. Possibly had irritation from amlodipine, so we'll start low-dose HCTZ.  -Continue clonidine 0.1 mg twice a day  -Add HCTZ 12.5 mg one half daily for the next few days, then if tolerating this week can increase to full pill daily. Recheck in 2-3 weeks for control.   -Check BMP.  Acute pain of right knee Osteoarthritis of right knee, unspecified osteoarthritis type  -Prior osteoarthritis with episodic right knee pain and swelling. Degenerative meniscal disease also discussed as possibility. Due to elevated blood pressure, and long-term risks of NSAIDs, will try topical Voltaren 4 g 4 times a day. Would consider Ortho eval for possible injection as well. Can discuss further at follow-up in next few weeks  Class 2 obesity with body mass index (BMI) of 38.0 to 38.9 in adult, unspecified obesity type, unspecified whether serious comorbidity present - Plan: Amb Ref to Medical Weight Management  -Currently on phentermine. Referral placed to medical bariatric specialist to discuss medication options. Blood pressure elevation possible with phentermine, will avoid any changes at this time.  Meds ordered this encounter  Medications  . cloNIDine (CATAPRES) 0.1 MG tablet    Sig: Take 1 tablet (0.1 mg total) by mouth 2 (two) times daily.    Dispense:  60 tablet    Refill:  1  . hydrochlorothiazide (HYDRODIURIL) 12.5 MG tablet    Sig: Take 1 tablet (12.5 mg total) by mouth daily. Ok to start 1/2 tab initially, then full tab in 1 week.    Dispense:  90 tablet    Refill:  1  . diclofenac (VOLTAREN) 75 MG EC tablet    Sig: Take 1 tablet (75 mg total) by mouth 2 (two) times daily.    Dispense:  30 tablet    Refill:  0  . diclofenac sodium (VOLTAREN) 1 % GEL    Sig: Apply 4 g topically  4 (four) times daily. R knee    Dispense:  100 g    Refill:  0   Patient Instructions   Start low-dose hydrochlorothiazide, can increase to one full pill in next week. Continue clonidine the same dose. Keep a record of your blood pressures outside  of the office and bring them to the next office visit.  For knee pain and swelling, try tylenol first, but if needed can try the topical anti-inflammatory   I placed a referral to Dr. Leafy Ro to discuss weight loss medications. Her number is [phone].   Follow-up with me in next few weeks to review blood pressure control as well as discuss other options for your knee. As we discussed orthopedic referral can be placed at that time if needed.  Return to the clinic or go to the nearest emergency room if any of your symptoms worsen or new symptoms occur.    IF you received an x-ray today, you will receive an invoice from [facility]. Please contact [facility] Radiology at [phone] with questions or concerns regarding your invoice.   IF you received labwork today, you will receive an invoice from [facility]. Please contact [facility] at [phone] with questions or concerns regarding your invoice.   Our billing staff will not be able to assist you with questions regarding bills from these companies.  You will be contacted with the lab results as soon as they are available. The fastest way to get your results is to activate your My Chart account. Instructions are located on the last page of this paperwork. If you have not heard from [place] regarding the results in 2 weeks, please contact this office.       I personally performed the services described in this documentation, which was scribed in my presence. The recorded information has been reviewed and considered for accuracy and completeness, addended by me as needed, and agree with information above.  Signed,   Merri Ray, MD Primary Care at [place].    12/27/16 3:12 PM

## 2016-12-29 ENCOUNTER — Telehealth: Payer: Self-pay

## 2016-12-29 LAB — BASIC METABOLIC PANEL
BUN/Creatinine Ratio: 15 (ref 9–23)
BUN: 11 mg/dL (ref 6–24)
CO2: 21 mmol/L (ref 20–29)
Calcium: 9.5 mg/dL (ref 8.7–10.2)
Chloride: 100 mmol/L (ref 96–106)
Creatinine, Ser: 0.73 mg/dL (ref 0.57–1.00)
GFR, EST AFRICAN AMERICAN: 114 mL/min/{1.73_m2} (ref >59)
GFR, EST NON AFRICAN AMERICAN: 99 mL/min/{1.73_m2} (ref >59)
Glucose: 144 mg/dL — ABNORMAL HIGH (ref 65–99)
POTASSIUM: 4.4 mmol/L (ref 3.5–5.2)
SODIUM: 140 mmol/L (ref 134–144)

## 2016-12-29 LAB — HEMOGLOBIN A1C

## 2016-12-29 NOTE — Telephone Encounter (Signed)
PA started for Diclofenac Sodium 1% Gel  Awaiting Response

## 2016-12-30 ENCOUNTER — Other Ambulatory Visit: Payer: Self-pay | Admitting: Family Medicine

## 2016-12-30 MED ORDER — DICLOFENAC SODIUM 1.5 % TD SOLN: TRANSDERMAL | 1 refills | Status: DC

## 2016-12-30 NOTE — Progress Notes (Signed)
[facility] did not cover Voltaren 1% gel. Requested other medication or 1.5% solution with  indications for chronic pain and possible toxicity from oral NSAIDs. History of hypertension and reflux - avoiding oral NSAID for that reason. We'll try 1.5% diclofenac solution, Rx sent.

## 2017-01-02 NOTE — Telephone Encounter (Signed)
Diclofenac Gel Denied Pt must have tried 2 alternatives of NSAIDS: Ibuprofen, aleve, celecoxib, meloxicam.  (Only Ibuprofen is documented that patient has tried)

## 2017-01-02 NOTE — Telephone Encounter (Signed)
Denial notice placed in box

## 2017-01-04 LAB — SPECIMEN STATUS REPORT

## 2017-01-07 ENCOUNTER — Other Ambulatory Visit: Payer: Self-pay | Admitting: Family Medicine

## 2017-01-07 DIAGNOSIS — R739 Hyperglycemia, unspecified: Secondary | ICD-10-CM

## 2017-01-07 NOTE — Progress Notes (Signed)
See lab notes

## 2017-01-15 NOTE — Telephone Encounter (Signed)
Please advise patient for reason of denial of Voltaren. She can try Aleve over-the-counter and if no relief with Aleve, would potentially be authorized at that time. Other option is to follow-up with orthopedics regarding injection as we discussed at last visit. Let me know what she would like to do.

## 2017-01-16 NOTE — Telephone Encounter (Signed)
Left message to return call with option.

## 2017-01-18 NOTE — Telephone Encounter (Signed)
LMVM to CB to discuss options with Voltaren.

## 2017-01-23 ENCOUNTER — Emergency Department (HOSPITAL_COMMUNITY)
Admission: EM | Admit: 2017-01-23 | Discharge: 2017-01-24 | Disposition: A | Discharge status: Home / Self Care | Payer: BLUE CROSS/BLUE SHIELD | Attending: Emergency Medicine | Admitting: Emergency Medicine

## 2017-01-23 ENCOUNTER — Encounter (HOSPITAL_COMMUNITY): Payer: Self-pay | Admitting: Emergency Medicine

## 2017-01-23 DIAGNOSIS — J45909 Unspecified asthma, uncomplicated: Secondary | ICD-10-CM | POA: Diagnosis not present

## 2017-01-23 DIAGNOSIS — Z79899 Other long term (current) drug therapy: Secondary | ICD-10-CM | POA: Diagnosis not present

## 2017-01-23 DIAGNOSIS — I10 Essential (primary) hypertension: Secondary | ICD-10-CM | POA: Diagnosis not present

## 2017-01-23 DIAGNOSIS — R1033 Periumbilical pain: Secondary | ICD-10-CM | POA: Diagnosis present

## 2017-01-23 DIAGNOSIS — K5732 Diverticulitis of large intestine without perforation or abscess without bleeding: Secondary | ICD-10-CM | POA: Insufficient documentation

## 2017-01-23 DIAGNOSIS — K5792 Diverticulitis of intestine, part unspecified, without perforation or abscess without bleeding: Secondary | ICD-10-CM

## 2017-01-23 DIAGNOSIS — R109 Unspecified abdominal pain: Secondary | ICD-10-CM | POA: Diagnosis not present

## 2017-01-23 LAB — COMPREHENSIVE METABOLIC PANEL
ALT: 24 U/L (ref 14–54)
AST: 23 U/L (ref 15–41)
Albumin: 3.9 g/dL (ref 3.5–5.0)
Alkaline Phosphatase: 62 U/L (ref 38–126)
Anion gap: 8 (ref 5–15)
BILIRUBIN TOTAL: 0.9 mg/dL (ref 0.3–1.2)
BUN: 12 mg/dL (ref 6–20)
CALCIUM: 9.5 mg/dL (ref 8.9–10.3)
CHLORIDE: 103 mmol/L (ref 101–111)
CO2: 27 mmol/L (ref 22–32)
CREATININE: 0.73 mg/dL (ref 0.44–1.00)
GFR calc Af Amer: >60 mL/min (ref >60)
Glucose, Bld: 118 mg/dL — ABNORMAL HIGH (ref 65–99)
Potassium: 3.6 mmol/L (ref 3.5–5.1)
Sodium: 138 mmol/L (ref 135–145)
TOTAL PROTEIN: 7.5 g/dL (ref 6.5–8.1)

## 2017-01-23 LAB — CBC
HCT: 35.9 % — ABNORMAL LOW (ref 36.0–46.0)
Hemoglobin: 11.5 g/dL — ABNORMAL LOW (ref 12.0–15.0)
MCH: 24.6 pg — ABNORMAL LOW (ref 26.0–34.0)
MCHC: 32 g/dL (ref 30.0–36.0)
MCV: 76.7 fL — AB (ref 78.0–100.0)
Platelets: 223 10*3/uL (ref 150–400)
RBC: 4.68 MIL/uL (ref 3.87–5.11)
RDW: 15.7 % — AB (ref 11.5–15.5)
WBC: 7.9 10*3/uL (ref 4.0–10.5)

## 2017-01-23 LAB — LIPASE, BLOOD: Lipase: 33 U/L (ref 11–51)

## 2017-01-23 NOTE — ED Triage Notes (Signed)
Patient complaining of abdominal pain all over. Patient states this started about 11 am. Patient states she took a stool softer but it did not work. She states that it tightens up and then let go. She also state there is a shooting pain.

## 2017-01-24 ENCOUNTER — Emergency Department (HOSPITAL_COMMUNITY): Payer: BLUE CROSS/BLUE SHIELD

## 2017-01-24 ENCOUNTER — Encounter (HOSPITAL_COMMUNITY): Payer: Self-pay

## 2017-01-24 DIAGNOSIS — R109 Unspecified abdominal pain: Secondary | ICD-10-CM | POA: Diagnosis not present

## 2017-01-24 LAB — URINALYSIS, ROUTINE W REFLEX MICROSCOPIC
BILIRUBIN URINE: NEGATIVE
GLUCOSE, UA: NEGATIVE mg/dL
HGB URINE DIPSTICK: NEGATIVE
Ketones, ur: NEGATIVE mg/dL
Leukocytes, UA: NEGATIVE
Nitrite: NEGATIVE
PH: 5 (ref 5.0–8.0)
Protein, ur: NEGATIVE mg/dL
Specific Gravity, Urine: 1.017 (ref 1.005–1.030)

## 2017-01-24 MED ORDER — MORPHINE SULFATE (PF) 2 MG/ML IV SOLN
4.0000 mg | Freq: Once | INTRAVENOUS | Status: AC
  Administered 2017-01-24: 4 mg via INTRAVENOUS
  Filled 2017-01-24: qty 2

## 2017-01-24 MED ORDER — CIPROFLOXACIN HCL 500 MG PO TABS: 500.0000 mg | ORAL_TABLET | Freq: Two times a day (BID) | ORAL | 0 refills | Status: DC

## 2017-01-24 MED ORDER — ONDANSETRON HCL 4 MG/2ML IJ SOLN
4.0000 mg | Freq: Once | INTRAMUSCULAR | Status: AC
  Administered 2017-01-24: 4 mg via INTRAVENOUS
  Filled 2017-01-24: qty 2

## 2017-01-24 MED ORDER — ONDANSETRON 4 MG PO TBDP: 4.0000 mg | ORAL_TABLET | Freq: Three times a day (TID) | ORAL | 0 refills | Status: DC | PRN

## 2017-01-24 MED ORDER — OXYCODONE-ACETAMINOPHEN 5-325 MG PO TABS
1.0000 | ORAL_TABLET | Freq: Once | ORAL | Status: AC
  Administered 2017-01-24: 1 via ORAL
  Filled 2017-01-24: qty 1

## 2017-01-24 MED ORDER — METRONIDAZOLE 500 MG PO TABS
500.0000 mg | ORAL_TABLET | Freq: Once | ORAL | Status: AC
  Administered 2017-01-24: 500 mg via ORAL
  Filled 2017-01-24: qty 1

## 2017-01-24 MED ORDER — IOPAMIDOL (ISOVUE-300) INJECTION 61%
INTRAVENOUS | Status: AC
  Administered 2017-01-24: 100 mL via INTRAVENOUS
  Filled 2017-01-24: qty 100

## 2017-01-24 MED ORDER — METRONIDAZOLE 500 MG PO TABS: 500.0000 mg | ORAL_TABLET | Freq: Two times a day (BID) | ORAL | 0 refills | Status: DC

## 2017-01-24 MED ORDER — OXYCODONE-ACETAMINOPHEN 5-325 MG PO TABS: 1.0000 | ORAL_TABLET | Freq: Four times a day (QID) | ORAL | 0 refills | Status: DC | PRN

## 2017-01-24 MED ORDER — CIPROFLOXACIN HCL 500 MG PO TABS
500.0000 mg | ORAL_TABLET | Freq: Once | ORAL | Status: AC
  Administered 2017-01-24: 500 mg via ORAL
  Filled 2017-01-24: qty 1

## 2017-01-24 MED ORDER — IOPAMIDOL (ISOVUE-300) INJECTION 61%
100.0000 mL | Freq: Once | INTRAVENOUS | Status: AC | PRN
  Administered 2017-01-24: 100 mL via INTRAVENOUS

## 2017-01-24 NOTE — Discharge Instructions (Signed)
Return for any new or worsening symptoms including fever, worsening pain, vomiting.

## 2017-01-24 NOTE — ED Provider Notes (Signed)
[facility] DEPT Provider Note   CSN: [id number] Arrival date & time: 01/23/17  2044     History   Chief Complaint Chief Complaint  Patient presents with  . Abdominal Pain    HPI [name] is a 46 y.o. female.  HPI  This is a 46 year old female with a history of gallstones, hypertension who presents with abdominal pain. Patient reports 2 day history of sharp periumbilical and left-sided abdominal pain. It is nonradiating. Currently an 8 out of 10. She has not taken anything for her pain. She denies any nausea or vomiting. She denies any fevers.  She denies any diarrhea. She reports that she initially thought she was constipated. She had a small bowel movement earlier today. Denies any urinary symptoms, chest pain, shortness of breath.  Past Medical History:  Diagnosis Date  . Anxiety   . Asthma    IN PAST NO RECENT PROBLEMS  . Blood clot in vein 2008   right leg  . Depression   . Epigastric pain   . Gallstones   . Headache(784.0)    Migraines  . Heartburn   . Hypertension   . Iron deficiency anemia     Patient Active Problem List   Diagnosis Date Noted  . Environmental allergies 11/04/2015  . Fibroids 10/12/2015  . Essential hypertension 03/19/2015  . Chronic cholecystitis 09/08/2011  . IRON DEFICIENCY 10/22/2009  . ABDOMINAL PAIN, LEFT UPPER QUADRANT 07/22/2009  . ESOPHAGEAL REFLUX 04/17/2009    Past Surgical History:  Procedure Laterality Date  . CHOLECYSTECTOMY  10/05/2011   Procedure: LAPAROSCOPIC CHOLECYSTECTOMY WITH INTRAOPERATIVE CHOLANGIOGRAM;  Surgeon: Pedro Earls, MD;  Location: [facility];  Service: General;  Laterality: N/A;  . COLONOSCOPY    . ROBOTIC ASSISTED TOTAL HYSTERECTOMY WITH SALPINGECTOMY Bilateral 10/12/2015   Procedure: ROBOTIC ASSISTED TOTAL HYSTERECTOMY WITH SALPINGECTOMY;  Surgeon: Brien Few, MD;  Location: [facility];  Service: Gynecology;  Laterality: Bilateral;  . TUBAL LIGATION    . TUBAL REVERSAL      OB History    No data available       Home Medications    Prior to Admission medications   Medication Sig Start Date End Date Taking? Authorizing Provider  cetirizine (ZYRTEC) 10 MG tablet Take 10 mg by mouth daily as needed for allergies. Reported on 11/04/2015   Yes [provider]  cloNIDine (CATAPRES) 0.1 MG tablet Take 1 tablet (0.1 mg total) by mouth 2 (two) times daily. 12/27/16  Yes Wendie Agreste, MD  diclofenac (VOLTAREN) 75 MG EC tablet Take 1 tablet (75 mg total) by mouth 2 (two) times daily. 12/27/16  Yes Wendie Agreste, MD  diclofenac sodium (VOLTAREN) 1 % GEL Apply 1 application topically as needed (pain).  12/29/16  Yes [provider]  EPINEPHrine 0.3 mg/0.3 mL IJ SOAJ injection Inject 0.3 mLs (0.3 mg total) into the muscle once. 11/04/15  Yes Darlyne Russian, MD  ferrous sulfate 325 (65 FE) MG tablet Take 325 mg by mouth daily with breakfast.   Yes [provider]  hydrochlorothiazide (HYDRODIURIL) 12.5 MG tablet Take 1 tablet (12.5 mg total) by mouth daily. Ok to start 1/2 tab initially, then full tab in 1 week. Patient taking differently: Take 12.5 mg by mouth daily.  12/27/16  Yes Wendie Agreste, MD  phentermine 37.5 MG capsule Take 37.5 mg by mouth every morning.   Yes [provider]  Diclofenac Sodium 1.5 % SOLN 4 gram topically to affected knee QID. Patient not taking:  Reported on 01/24/2017 12/30/16   Wendie Agreste, MD  ibuprofen (ADVIL,MOTRIN) 800 MG tablet Take 1 tablet (800 mg total) by mouth every 8 (eight) hours as needed for mild pain. Patient not taking: Reported on 01/24/2017 03/22/14   Drenda Freeze, MD    Family History Family History  Problem Relation Age of Onset  . Hypertension Mother   . Diabetes Mother   . Cancer Father   . Diabetes Unknown        Family History  . Colon cancer Neg Hx     Social History Social History  Substance Use Topics  . Smoking status: Never Smoker  . Smokeless tobacco: Never Used  .  Alcohol use Yes     Comment: RARE     Allergies   Ace inhibitors   Review of Systems Review of Systems  Constitutional: Negative for fever.  Respiratory: Negative for shortness of breath.   Cardiovascular: Negative for chest pain.  Gastrointestinal: Positive for abdominal pain. Negative for blood in stool, diarrhea, nausea and vomiting.  Genitourinary: Negative for dysuria.  All other systems reviewed and are negative.    Physical Exam Updated Vital Signs BP 136/75   Pulse 79   Temp 98.5 F (36.9 C) (Oral)   Resp 18   Ht 5\' 1"  (1.549 m)   Wt 90.7 kg (200 lb)   LMP 09/18/2015   SpO2 100%   BMI 37.79 kg/m   Physical Exam  Constitutional: She is oriented to person, place, and time. She appears well-developed and well-nourished.  overweight  HENT:  Head: Normocephalic and atraumatic.  Cardiovascular: Normal rate, regular rhythm and normal heart sounds.   No murmur heard. Pulmonary/Chest: Effort normal and breath sounds normal. No respiratory distress. She has no wheezes.  Abdominal: Soft. Bowel sounds are normal. She exhibits no mass. There is tenderness. There is guarding. There is no rebound. No hernia.  Tenderness palpation mostly over the left upper and lower quadrants with voluntary guarding, well-healed abdominal scars noted  Neurological: She is alert and oriented to person, place, and time.  Skin: Skin is warm and dry.  Psychiatric: She has a normal mood and affect.  Nursing note and vitals reviewed.    ED Treatments / Results  Labs (all labs ordered are listed, but only abnormal results are displayed) Labs Reviewed  COMPREHENSIVE METABOLIC PANEL - Abnormal; Notable for the following:       Result Value   Glucose, Bld 118 (*)    All other components within normal limits  CBC - Abnormal; Notable for the following:    Hemoglobin 11.5 (*)    HCT 35.9 (*)    MCV 76.7 (*)    MCH 24.6 (*)    RDW 15.7 (*)    All other components within normal limits    LIPASE, BLOOD  URINALYSIS, ROUTINE W REFLEX MICROSCOPIC    EKG  EKG Interpretation None       Radiology Ct Abdomen Pelvis W Contrast  Result Date: 01/24/2017 CLINICAL DATA:  Abdominal pain beginning at 1100 hours yesterday. History of hysterectomy, cholecystectomy. EXAM: CT ABDOMEN AND PELVIS WITH CONTRAST TECHNIQUE: Multidetector CT imaging of the abdomen and pelvis was performed using the standard protocol following bolus administration of intravenous contrast. CONTRAST:  100 cc Isovue-300 COMPARISON:  CT abdomen and pelvis Oct 08, 2011 FINDINGS: LOWER CHEST: Dependent atelectasis. Included heart size is normal. No pericardial effusion. HEPATOBILIARY: Status post cholecystectomy.  Normal liver. PANCREAS: Normal. SPLEEN: Normal. ADRENALS/URINARY TRACT: Kidneys are orthotopic,  demonstrating symmetric enhancement. No nephrolithiasis, hydronephrosis or solid renal masses. The unopacified ureters are normal in course and caliber. Delayed imaging through the kidneys demonstrates symmetric prompt contrast excretion within the proximal urinary collecting system. Urinary bladder is partially distended and unremarkable. Normal adrenal glands. STOMACH/BOWEL: Mild colonic diverticulosis. Superimposed focal wall thickening and pericolonic inflammation associated with a diverticulum transverse colon. The stomach, small bowel are normal in course and caliber without inflammatory changes. Normal appendix. VASCULAR/LYMPHATIC: Aortoiliac vessels are normal in course and caliber. Mild calcific atherosclerosis. No lymphadenopathy by CT size criteria. REPRODUCTIVE: Status post hysterectomy. OTHER: Trace free fluid in the pelvis. No focal fluid collection or intraperitoneal gas. MUSCULOSKELETAL: Nonacute. Small wide necked fat containing supra umbilical ventral hernia. IMPRESSION: Acute mild diverticulitis transverse colon. Aortic Atherosclerosis (ICD10-I70.0). Electronically Signed   By: Elon Alas M.D.   On:  01/24/2017 02:32    Procedures Procedures (including critical care time)  Medications Ordered in ED Medications  ciprofloxacin (CIPRO) tablet 500 mg (not administered)  metroNIDAZOLE (FLAGYL) tablet 500 mg (not administered)  morphine 2 MG/ML injection 4 mg (4 mg Intravenous Given 01/24/17 0158)  ondansetron (ZOFRAN) injection 4 mg (4 mg Intravenous Given 01/24/17 0142)  iopamidol (ISOVUE-300) 61 % injection 100 mL (100 mLs Intravenous Contrast Given 01/24/17 0207)     Initial Impression / Assessment and Plan / ED Course  I have reviewed the triage vital signs and the nursing notes.  Pertinent labs & imaging results that were available during my care of the patient were reviewed by me and considered in my medical decision making (see chart for details).     Patient presents with left-sided abdominal pain. She is nontoxic on exam. She does have tenderness without rebound or guarding. Lab work is initially reassuring. She is no obstructive symptoms. She is afebrile. However, given tenderness and guarding on exam, would elect to evaluate for possible early obstruction versus diverticular disease. No history of diverticulitis.  Patient given pain and nausea medication.  2:55 AM CT scan with mild acute diverticulitis. No acute complications. Patient updated. She is somewhat more comfortable. Will provide antibiotics and treat as an outpatient.  After history, exam, and medical workup I feel the patient has been appropriately medically screened and is safe for discharge home. Pertinent diagnoses were discussed with the patient. Patient was given return precautions.   Final Clinical Impressions(s) / ED Diagnoses   Final diagnoses:  Diverticulitis    New Prescriptions New Prescriptions   No medications on file     Merryl Hacker, MD 01/24/17 [phone]

## 2017-01-26 ENCOUNTER — Ambulatory Visit: Payer: BLUE CROSS/BLUE SHIELD | Admitting: Family Medicine

## 2017-02-01 ENCOUNTER — Other Ambulatory Visit: Payer: Self-pay | Admitting: Family Medicine

## 2017-02-27 ENCOUNTER — Other Ambulatory Visit: Payer: Self-pay | Admitting: Family Medicine

## 2017-02-27 DIAGNOSIS — I10 Essential (primary) hypertension: Secondary | ICD-10-CM

## 2017-03-22 ENCOUNTER — Other Ambulatory Visit: Payer: Self-pay | Admitting: Family Medicine

## 2017-03-22 DIAGNOSIS — I10 Essential (primary) hypertension: Secondary | ICD-10-CM

## 2017-04-25 DIAGNOSIS — N898 Other specified noninflammatory disorders of vagina: Secondary | ICD-10-CM | POA: Diagnosis not present

## 2017-04-25 DIAGNOSIS — M549 Dorsalgia, unspecified: Secondary | ICD-10-CM | POA: Diagnosis not present

## 2017-04-25 DIAGNOSIS — R319 Hematuria, unspecified: Secondary | ICD-10-CM | POA: Diagnosis not present

## 2017-05-11 ENCOUNTER — Other Ambulatory Visit: Payer: Self-pay | Admitting: Family Medicine

## 2017-05-11 DIAGNOSIS — I10 Essential (primary) hypertension: Secondary | ICD-10-CM

## 2017-05-25 ENCOUNTER — Encounter (INDEPENDENT_AMBULATORY_CARE_PROVIDER_SITE_OTHER): Payer: BLUE CROSS/BLUE SHIELD

## 2017-06-18 ENCOUNTER — Other Ambulatory Visit: Payer: Self-pay | Admitting: Family Medicine

## 2017-06-18 DIAGNOSIS — I10 Essential (primary) hypertension: Secondary | ICD-10-CM

## 2017-06-19 NOTE — Telephone Encounter (Signed)
Last OV 12/27/16.   Had appt for 05/25/17 but it was cancelled.  Don't see where she has rescheduled.

## 2017-06-20 ENCOUNTER — Other Ambulatory Visit: Payer: Self-pay | Admitting: Family Medicine

## 2017-06-20 DIAGNOSIS — I10 Essential (primary) hypertension: Secondary | ICD-10-CM

## 2017-06-20 NOTE — Telephone Encounter (Signed)
See refill request for Clonidine 0.1 mg bid; noted last ov was 12/27/16; pt. was to return to office in a few weeks to f/u on blood pressure control; 01/26/17 appt. was cancelled per pt.  Rec'd. last refill 05/12/17.

## 2017-06-27 ENCOUNTER — Encounter: Payer: Self-pay | Admitting: Family Medicine

## 2017-06-27 ENCOUNTER — Other Ambulatory Visit: Payer: Self-pay

## 2017-06-27 ENCOUNTER — Ambulatory Visit (INDEPENDENT_AMBULATORY_CARE_PROVIDER_SITE_OTHER): Payer: BLUE CROSS/BLUE SHIELD | Admitting: Family Medicine

## 2017-06-27 VITALS — BP 141/90 | HR 99 | Temp 98.3°F | Resp 17 | Ht 61.0 in | Wt 210.0 lb

## 2017-06-27 DIAGNOSIS — M7712 Lateral epicondylitis, left elbow: Secondary | ICD-10-CM

## 2017-06-27 DIAGNOSIS — R739 Hyperglycemia, unspecified: Secondary | ICD-10-CM | POA: Diagnosis not present

## 2017-06-27 DIAGNOSIS — M25522 Pain in left elbow: Secondary | ICD-10-CM | POA: Diagnosis not present

## 2017-06-27 DIAGNOSIS — E669 Obesity, unspecified: Secondary | ICD-10-CM

## 2017-06-27 DIAGNOSIS — I10 Essential (primary) hypertension: Secondary | ICD-10-CM | POA: Diagnosis not present

## 2017-06-27 DIAGNOSIS — Z6839 Body mass index (BMI) 39.0-39.9, adult: Secondary | ICD-10-CM | POA: Diagnosis not present

## 2017-06-27 MED ORDER — AMLODIPINE BESYLATE 2.5 MG PO TABS: 2.5000 mg | ORAL_TABLET | Freq: Every day | ORAL | 0 refills | Status: DC

## 2017-06-27 MED ORDER — HYDROCHLOROTHIAZIDE 12.5 MG PO TABS: 12.5000 mg | ORAL_TABLET | Freq: Every day | ORAL | 1 refills | Status: DC

## 2017-06-27 NOTE — Progress Notes (Signed)
Subjective:  By signing my name below, I, [name], attest that this documentation has been prepared under the direction and in the presence of Wendie Agreste, MD Electronically Signed: Ladene Artist, ED Scribe 06/27/2017 at 2:42 PM.   Patient ID: [name], female    DOB: [birth year], 47 y.o.   MRN: [id number]  Chief Complaint  Patient presents with  . Medication Refill    clonidine  . Arthritis    left elbow   HPI  [name] is a 47 y.o. female who presents to Primary Care at [facility] for f/u with refill on clonidine. Last seen by me in 12/2016.  HTN Had been on Lisinopril previously but questionable swelling sensation in her throat and cough. ENT eval reportedly indicated allergies and possible irritation form amlodipine for throat symptoms. Continued on clonidine 0.1 mg bid, added HCTZ 12.5 mg 1/2 pill then increased to full pill daily, advised to f/u in 2-3 wks. This is her 1st visit since 12/2016. -- Pt suspects that she was having a "tickle" in her throat when she was taking amlodipine due to cold-like symptoms which she was experiencing at that time. She ran out of clonidine last night and has noticed a tension HA since. Denies 'tickle" in her throat at this time, cp, sob, new cough, lightheadedness, dizziness.  Lab Results  Component Value Date   CREATININE 0.73 01/23/2017   Hyperglycemia Noted last visit. Recommended A1C at lab visit that has not been performed. -- Pt had lab work drawn at work, at [employer] in [place], on 1/9. She is not fasting at this visit; had a few lemon cookies PTA and a cup of coffee this morning.  Obesity  Wt Readings from Last 3 Encounters:  06/27/17 210 lb (95.3 kg)  01/23/17 200 lb (90.7 kg)  12/27/16 206 lb (93.4 kg)  Body mass index is 39.68 kg/m. Concerns discussed with phentermine use previously and HTN. Referral placed to medical bariatrics. -- Pt states that she tried to get in with them but they were backed up 4  months. She is still occasionally taking Phentermine.  L Elbow Pain Pt reports unchanged L elbow pain x 1 month. No known injury. Pain is worse with lifting and laying on it. She has tried 800 mg ibuprofen and topical arthritis cream without significant relief. Denies neck pain. Pt is R hand dominant.  Patient Active Problem List   Diagnosis Date Noted  . Environmental allergies 11/04/2015  . Fibroids 10/12/2015  . Essential hypertension 03/19/2015  . Chronic cholecystitis 09/08/2011  . IRON DEFICIENCY 10/22/2009  . ABDOMINAL PAIN, LEFT UPPER QUADRANT 07/22/2009  . ESOPHAGEAL REFLUX 04/17/2009   Past Medical History:  Diagnosis Date  . Anxiety   . Asthma    IN PAST NO RECENT PROBLEMS  . Blood clot in vein 2008   right leg  . Depression   . Epigastric pain   . Gallstones   . Headache(784.0)    Migraines  . Heartburn   . Hypertension   . Iron deficiency anemia    Past Surgical History:  Procedure Laterality Date  . CHOLECYSTECTOMY  10/05/2011   Procedure: LAPAROSCOPIC CHOLECYSTECTOMY WITH INTRAOPERATIVE CHOLANGIOGRAM;  Surgeon: Pedro Earls, MD;  Location: [facility];  Service: General;  Laterality: N/A;  . COLONOSCOPY    . ROBOTIC ASSISTED TOTAL HYSTERECTOMY WITH SALPINGECTOMY Bilateral 10/12/2015   Procedure: ROBOTIC ASSISTED TOTAL HYSTERECTOMY WITH SALPINGECTOMY;  Surgeon: Brien Few, MD;  Location: [facility];  Service: Gynecology;  Laterality: Bilateral;  . TUBAL LIGATION    . TUBAL REVERSAL     Allergies  Allergen Reactions  . Ace Inhibitors Cough    She did have borderline swelling to the tip of her uvula.   Prior to Admission medications   Medication Sig Start Date End Date Taking? Authorizing Provider  cetirizine (ZYRTEC) 10 MG tablet Take 10 mg by mouth daily as needed for allergies. Reported on 11/04/2015    [provider]  ciprofloxacin (CIPRO) 500 MG tablet Take 1 tablet (500 mg total) by mouth every 12 (twelve) hours. 01/24/17   Horton, Barbette Hair, MD    cloNIDine (CATAPRES) 0.1 MG tablet TAKE 1 TABLET BY MOUTH TWICE A DAY 05/12/17   Wendie Agreste, MD  diclofenac (VOLTAREN) 75 MG EC tablet Take 1 tablet (75 mg total) by mouth 2 (two) times daily. 12/27/16   Wendie Agreste, MD  diclofenac sodium (VOLTAREN) 1 % GEL Apply 1 application topically as needed (pain).  12/29/16   [provider]  Diclofenac Sodium 1.5 % SOLN 4 gram topically to affected knee QID. Patient not taking: Reported on 01/24/2017 12/30/16   Wendie Agreste, MD  EPINEPHrine 0.3 mg/0.3 mL IJ SOAJ injection Inject 0.3 mLs (0.3 mg total) into the muscle once. 11/04/15   Darlyne Russian, MD  ferrous sulfate 325 (65 FE) MG tablet Take 325 mg by mouth daily with breakfast.    [provider]  hydrochlorothiazide (HYDRODIURIL) 12.5 MG tablet TAKE 1 TABLET BY MOUTH EVERY DAY. OK TO START HALF OF A TABLET INITIALLY, THEN A FULL TAB IN 1 WEEK 06/19/17   Wendie Agreste, MD  ibuprofen (ADVIL,MOTRIN) 800 MG tablet Take 1 tablet (800 mg total) by mouth every 8 (eight) hours as needed for mild pain. Patient not taking: Reported on 01/24/2017 03/22/14   Drenda Freeze, MD  metroNIDAZOLE (FLAGYL) 500 MG tablet Take 1 tablet (500 mg total) by mouth 2 (two) times daily. 01/24/17   Horton, Barbette Hair, MD  ondansetron (ZOFRAN ODT) 4 MG disintegrating tablet Take 1 tablet (4 mg total) by mouth every 8 (eight) hours as needed for nausea or vomiting. 01/24/17   Horton, Barbette Hair, MD  oxyCODONE-acetaminophen (PERCOCET/ROXICET) 5-325 MG tablet Take 1 tablet by mouth every 6 (six) hours as needed for severe pain. 01/24/17   Horton, Barbette Hair, MD  phentermine 37.5 MG capsule Take 37.5 mg by mouth every morning.    [provider]   Social History   Socioeconomic History  . Marital status: Married    Spouse name: Not on file  . Number of children: 3  . Years of education: Not on file  . Highest education level: Not on file  Social Needs  . Financial resource strain: Not  on file  . Food insecurity - worry: Not on file  . Food insecurity - inability: Not on file  . Transportation needs - medical: Not on file  . Transportation needs - non-medical: Not on file  Occupational History  . Occupation: CNA    Comment: Marketing executive: [employer]  Tobacco Use  . Smoking status: Never Smoker  . Smokeless tobacco: Never Used  Substance and Sexual Activity  . Alcohol use: Yes    Comment: RARE  . Drug use: No  . Sexual activity: Yes    Birth control/protection: None  Other Topics Concern  . Not on file  Social History Narrative   2 caffeine drinks daily  Patient does not get regular exercise.   Review of Systems  Constitutional: Negative for fatigue and unexpected weight change.  Respiratory: Negative for chest tightness and shortness of breath.   Cardiovascular: Negative for chest pain, palpitations and leg swelling.  Gastrointestinal: Negative for abdominal pain and blood in stool.  Musculoskeletal: Positive for arthralgias. Negative for neck pain.  Neurological: Positive for headaches. Negative for dizziness, syncope and light-headedness.      Objective:   Physical Exam  Constitutional: She is oriented to person, place, and time. She appears well-developed and well-nourished. No distress.  HENT:  Head: Normocephalic and atraumatic.  Eyes: Conjunctivae and EOM are normal. Pupils are equal, round, and reactive to light.  Neck: Neck supple. Carotid bruit is not present. No tracheal deviation present.  Cardiovascular: Normal rate, regular rhythm, normal heart sounds and intact distal pulses.  Pulmonary/Chest: Effort normal and breath sounds normal. No respiratory distress.  Abdominal: Soft. She exhibits no pulsatile midline mass. There is no tenderness.  Musculoskeletal: Normal range of motion.  L elbow: full flexion and extension. No soft tissue swelling or wounds. Skin is intact. Slight discomfort over lateral epicondyle and into the  proximal dorsal forearm. Some discomfort into triceps tendon. Biceps, triceps strength intact. No other bony tenderness. Pain into lateral elbow with resisted wrist extension.  Neurological: She is alert and oriented to person, place, and time.  Skin: Skin is warm and dry.  Psychiatric: She has a normal mood and affect. Her behavior is normal.  Nursing note and vitals reviewed.  Vitals:   06/27/17 1421  BP: (!) 141/90  Pulse: 99  Resp: 17  Temp: 98.3 F (36.8 C)  TempSrc: Oral  SpO2: 99%  Weight: 210 lb (95.3 kg)  Height: 5\' 1"  (1.549 m)      Assessment & Plan:    [name] is a 47 y.o. female Essential hypertension - Plan: amLODipine (NORVASC) 2.5 MG tablet, Basic metabolic panel, hydrochlorothiazide (HYDRODIURIL) 12.5 MG tablet  - Slight elevation off clonidine. We'll try to restart amlodipine and place a clonidine, continue HCTZ, monitor home readings and recheck next few weeks. If any new side effects on amlodipine, return to discuss this further. Unlikely cause of previous throat symptoms or cough. Check BMP  Hyperglycemia - Plan: Hemoglobin A1c  - Check A1c. Can also bring blood work from her recent work physical to next visit  Class 2 obesity without serious comorbidity with body mass index (BMI) of 39.0 to 39.9 in adult, unspecified obesity type  - Check A1c, phone number provided for medical bariatric specialist to discuss different treatment options. Would also consider medication and effect on blood pressure.  Left elbow pain - Plan: Brace application Lateral epicondylitis of left elbow - Plan: Brace application  -Suspected lateral epicondylitis. Counterforce brace applied, handout given, recheck next few weeks. Also discussed avoidance of activities that worsen pain. Consider eccentric strengthening if improving next visit  Meds ordered this encounter  Medications  . amLODipine (NORVASC) 2.5 MG tablet    Sig: Take 1 tablet (2.5 mg total) by mouth daily.     Dispense:  90 tablet    Refill:  0  . hydrochlorothiazide (HYDRODIURIL) 12.5 MG tablet    Sig: Take 1 tablet (12.5 mg total) by mouth daily.    Dispense:  90 tablet    Refill:  1   Patient Instructions   Try stopping clonidine and start amlodipine once per day. Continue hydrochlorothiazide once per day. If any new side effects on  new medication - return to discuss plan.   Call Dr. Leafy Ro for appointment to discuss weight loss options. [phone]  I will check electrolytes today and blood sugar tests, but please bring copy of blood work from work physical to your next visit.   Left elbow pain appears to be lateral epicondylitis and possible overuse or irritation of triceps muscle. Try the counterforce brace and see other information on tennis elbow below. Avoid carrying with palm down as discussed. Recheck elbow symptoms as well as blood pressure in the next 2-3 weeks. If improving, will likely have you start some home rehabilitation for that elbow. Thank you for coming in today.  Return to the clinic or go to the nearest emergency room if any of your symptoms worsen or new symptoms occur.    Tennis Elbow Tennis elbow (lateral epicondylitis) is inflammation of the outer tendons of your forearm close to your elbow. Your tendons attach your muscles to your bones. The outer tendons of your forearm are used to extend your wrist, and they attach on the outside part of your elbow. Tennis elbow is often found in people who play tennis, but anyone may get the condition from repeatedly extending the wrist or turning the forearm. What are the causes? This condition is caused by repeatedly extending your wrist and using your hands. It can result from sports or work that requires repetitive forearm movements. Tennis elbow may also be caused by an injury. What increases the risk? You have a higher risk of developing tennis elbow if you play tennis or another racquet sport. You also have a higher risk if  you frequently use your hands for work. This condition is also more likely to develop in:  Musicians.  Carpenters, painters, and plumbers.  Cooks.  Cashiers.  People who work in [employer].  Architect workers.  Butchers.  People who use computers.  What are the signs or symptoms? Symptoms of this condition include:  Pain and tenderness in your forearm and the outer part of your elbow. You may only feel the pain when you use your arm, or you may feel it even when you are not using your arm.  A burning feeling that runs from your elbow through your arm.  Weak grip in your hands.  How is this diagnosed? This condition may be diagnosed by medical history and physical exam. You may also have other tests, including:  X-rays.  MRI.  How is this treated? Your health care provider will recommend lifestyle adjustments, such as resting and icing your arm. Treatment may also include:  Medicines for inflammation. This may include shots of cortisone if your pain continues.  Physical therapy. This may include massage or exercises.  An elbow brace.  Surgery may eventually be recommended if your pain does not go away with treatment. Follow these instructions at home: Activity  Rest your elbow and wrist as directed by your health care provider. Try to avoid any activities that caused the problem until your health care provider says that you can do them again.  If a physical therapist teaches you exercises, do all of them as directed.  If you lift an object, lift it with your palm facing upward. This lowers the stress on your elbow. Lifestyle  If your tennis elbow is caused by sports, check your equipment and make sure that: ? You are using it correctly. ? It is the best fit for you.  If your tennis elbow is caused by work, take breaks frequently,  if you are able. Talk with your manager about how to best perform tasks in a way that is safe. ? If your tennis elbow is caused  by computer use, talk with your manager about any changes that can be made to your work environment. General instructions  If directed, apply ice to the painful area: ? Put ice in a plastic bag. ? Place a towel between your skin and the bag. ? Leave the ice on for 20 minutes, 2-3 times per day.  Take medicines only as directed by your health care provider.  If you were given a brace, wear it as directed by your health care provider.  Keep all follow-up visits as directed by your health care provider. This is important. Contact a health care provider if:  Your pain does not get better with treatment.  Your pain gets worse.  You have numbness or weakness in your forearm, hand, or fingers. This information is not intended to replace advice given to you by your health care provider. Make sure you discuss any questions you have with your health care provider. Document Released: 05/23/2005 Document Revised: 01/21/2016 Document Reviewed: 05/19/2014 Elsevier Interactive Patient Education  2018 [employer].   Hypertension Hypertension, commonly called high blood pressure, is when the force of blood pumping through the arteries is too strong. The arteries are the blood vessels that carry blood from the heart throughout the body. Hypertension forces the heart to work harder to pump blood and may cause arteries to become narrow or stiff. Having untreated or uncontrolled hypertension can cause heart attacks, strokes, kidney disease, and other problems. A blood pressure reading consists of a higher number over a lower number. Ideally, your blood pressure should be below 120/80. The first ("top") number is called the systolic pressure. It is a measure of the pressure in your arteries as your heart beats. The second ("bottom") number is called the diastolic pressure. It is a measure of the pressure in your arteries as the heart relaxes. What are the causes? The cause of this condition is not  known. What increases the risk? Some risk factors for high blood pressure are under your control. Others are not. Factors you can change  Smoking.  Having type 2 diabetes mellitus, high cholesterol, or both.  Not getting enough exercise or physical activity.  Being overweight.  Having too much fat, sugar, calories, or salt (sodium) in your diet.  Drinking too much alcohol. Factors that are difficult or impossible to change  Having chronic kidney disease.  Having a family history of high blood pressure.  Age. Risk increases with age.  Race. You may be at higher risk if you are African-American.  Gender. Men are at higher risk than women before age 65. After age 6, women are at higher risk than men.  Having obstructive sleep apnea.  Stress. What are the signs or symptoms? Extremely high blood pressure (hypertensive crisis) may cause:  Headache.  Anxiety.  Shortness of breath.  Nosebleed.  Nausea and vomiting.  Severe chest pain.  Jerky movements you cannot control (seizures).  How is this diagnosed? This condition is diagnosed by measuring your blood pressure while you are seated, with your arm resting on a surface. The cuff of the blood pressure monitor will be placed directly against the skin of your upper arm at the level of your heart. It should be measured at least twice using the same arm. Certain conditions can cause a difference in blood pressure between your right  and left arms. Certain factors can cause blood pressure readings to be lower or higher than normal (elevated) for a short period of time:  When your blood pressure is higher when you are in a health care provider's office than when you are at home, this is called white coat hypertension. Most people with this condition do not need medicines.  When your blood pressure is higher at home than when you are in a health care provider's office, this is called masked hypertension. Most people with this  condition may need medicines to control blood pressure.  If you have a high blood pressure reading during one visit or you have normal blood pressure with other risk factors:  You may be asked to return on a different day to have your blood pressure checked again.  You may be asked to monitor your blood pressure at home for 1 week or longer.  If you are diagnosed with hypertension, you may have other blood or imaging tests to help your health care provider understand your overall risk for other conditions. How is this treated? This condition is treated by making healthy lifestyle changes, such as eating healthy foods, exercising more, and reducing your alcohol intake. Your health care provider may prescribe medicine if lifestyle changes are not enough to get your blood pressure under control, and if:  Your systolic blood pressure is above 130.  Your diastolic blood pressure is above 80.  Your personal target blood pressure may vary depending on your medical conditions, your age, and other factors. Follow these instructions at home: Eating and drinking  Eat a diet that is high in fiber and potassium, and low in sodium, added sugar, and fat. An example eating plan is called the DASH (Dietary Approaches to Stop Hypertension) diet. To eat this way: ? Eat plenty of fresh fruits and vegetables. Try to fill half of your plate at each meal with fruits and vegetables. ? Eat whole grains, such as whole wheat pasta, brown rice, or whole grain bread. Fill about one quarter of your plate with whole grains. ? Eat or drink low-fat dairy products, such as skim milk or low-fat yogurt. ? Avoid fatty cuts of meat, processed or cured meats, and poultry with skin. Fill about one quarter of your plate with lean proteins, such as fish, chicken without skin, beans, eggs, and tofu. ? Avoid premade and processed foods. These tend to be higher in sodium, added sugar, and fat.  Reduce your daily sodium intake. Most  people with hypertension should eat less than 1,500 mg of sodium a day.  Limit alcohol intake to no more than 1 drink a day for nonpregnant women and 2 drinks a day for men. One drink equals 12 oz of beer, 5 oz of wine, or 1 oz of hard liquor. Lifestyle  Work with your health care provider to maintain a healthy body weight or to lose weight. Ask what an ideal weight is for you.  Get at least 30 minutes of exercise that causes your heart to beat faster (aerobic exercise) most days of the week. Activities may include walking, swimming, or biking.  Include exercise to strengthen your muscles (resistance exercise), such as pilates or lifting weights, as part of your weekly exercise routine. Try to do these types of exercises for 30 minutes at least 3 days a week.  Do not use any products that contain nicotine or tobacco, such as cigarettes and e-cigarettes. If you need help quitting, ask your health care provider.  Monitor your blood pressure at home as told by your health care provider.  Keep all follow-up visits as told by your health care provider. This is important. Medicines  Take over-the-counter and prescription medicines only as told by your health care provider. Follow directions carefully. Blood pressure medicines must be taken as prescribed.  Do not skip doses of blood pressure medicine. Doing this puts you at risk for problems and can make the medicine less effective.  Ask your health care provider about side effects or reactions to medicines that you should watch for. Contact a health care provider if:  You think you are having a reaction to a medicine you are taking.  You have headaches that keep coming back (recurring).  You feel dizzy.  You have swelling in your ankles.  You have trouble with your vision. Get help right away if:  You develop a severe headache or confusion.  You have unusual weakness or numbness.  You feel faint.  You have severe pain in your  chest or abdomen.  You vomit repeatedly.  You have trouble breathing. Summary  Hypertension is when the force of blood pumping through your arteries is too strong. If this condition is not controlled, it may put you at risk for serious complications.  Your personal target blood pressure may vary depending on your medical conditions, your age, and other factors. For most people, a normal blood pressure is less than 120/80.  Hypertension is treated with lifestyle changes, medicines, or a combination of both. Lifestyle changes include weight loss, eating a healthy, low-sodium diet, exercising more, and limiting alcohol. This information is not intended to replace advice given to you by your health care provider. Make sure you discuss any questions you have with your health care provider. Document Released: 05/23/2005 Document Revised: 04/20/2016 Document Reviewed: 04/20/2016 Elsevier Interactive Patient Education  2018 [employer].   IF you received an x-ray today, you will receive an invoice from [facility]. Please contact [facility] at [phone] with questions or concerns regarding your invoice.   IF you received labwork today, you will receive an invoice from [facility]. Please contact [facility] at [phone] with questions or concerns regarding your invoice.   Our billing staff will not be able to assist you with questions regarding bills from these companies.  You will be contacted with the lab results as soon as they are available. The fastest way to get your results is to activate your My Chart account. Instructions are located on the last page of this paperwork. If you have not heard from [place] regarding the results in 2 weeks, please contact this office.      I personally performed the services described in this documentation, which was scribed in my presence. The recorded information has been reviewed and considered for accuracy and completeness, addended by me  as needed, and agree with information above.  Signed,   Merri Ray, MD [facility] [place].  06/27/17 6:57 PM

## 2017-06-27 NOTE — Patient Instructions (Addendum)
Try stopping clonidine and start amlodipine once per day. Continue hydrochlorothiazide once per day. If any new side effects on new medication - return to discuss plan.   Call Dr. Leafy Ro for appointment to discuss weight loss options. [phone]  I will check electrolytes today and blood sugar tests, but please bring copy of blood work from work physical to your next visit.   Left elbow pain appears to be lateral epicondylitis and possible overuse or irritation of triceps muscle. Try the counterforce brace and see other information on tennis elbow below. Avoid carrying with palm down as discussed. Recheck elbow symptoms as well as blood pressure in the next 2-3 weeks. If improving, will likely have you start some home rehabilitation for that elbow. Thank you for coming in today.  Return to the clinic or go to the nearest emergency room if any of your symptoms worsen or new symptoms occur.    Tennis Elbow Tennis elbow (lateral epicondylitis) is inflammation of the outer tendons of your forearm close to your elbow. Your tendons attach your muscles to your bones. The outer tendons of your forearm are used to extend your wrist, and they attach on the outside part of your elbow. Tennis elbow is often found in people who play tennis, but anyone may get the condition from repeatedly extending the wrist or turning the forearm. What are the causes? This condition is caused by repeatedly extending your wrist and using your hands. It can result from sports or work that requires repetitive forearm movements. Tennis elbow may also be caused by an injury. What increases the risk? You have a higher risk of developing tennis elbow if you play tennis or another racquet sport. You also have a higher risk if you frequently use your hands for work. This condition is also more likely to develop in:  Musicians.  Carpenters, painters, and plumbers.  Cooks.  Cashiers.  People who work in [employer].  Architect  workers.  Butchers.  People who use computers.  What are the signs or symptoms? Symptoms of this condition include:  Pain and tenderness in your forearm and the outer part of your elbow. You may only feel the pain when you use your arm, or you may feel it even when you are not using your arm.  A burning feeling that runs from your elbow through your arm.  Weak grip in your hands.  How is this diagnosed? This condition may be diagnosed by medical history and physical exam. You may also have other tests, including:  X-rays.  MRI.  How is this treated? Your health care provider will recommend lifestyle adjustments, such as resting and icing your arm. Treatment may also include:  Medicines for inflammation. This may include shots of cortisone if your pain continues.  Physical therapy. This may include massage or exercises.  An elbow brace.  Surgery may eventually be recommended if your pain does not go away with treatment. Follow these instructions at home: Activity  Rest your elbow and wrist as directed by your health care provider. Try to avoid any activities that caused the problem until your health care provider says that you can do them again.  If a physical therapist teaches you exercises, do all of them as directed.  If you lift an object, lift it with your palm facing upward. This lowers the stress on your elbow. Lifestyle  If your tennis elbow is caused by sports, check your equipment and make sure that: ? You are using it correctly. ?  It is the best fit for you.  If your tennis elbow is caused by work, take breaks frequently, if you are able. Talk with your manager about how to best perform tasks in a way that is safe. ? If your tennis elbow is caused by computer use, talk with your manager about any changes that can be made to your work environment. General instructions  If directed, apply ice to the painful area: ? Put ice in a plastic bag. ? Place a towel  between your skin and the bag. ? Leave the ice on for 20 minutes, 2-3 times per day.  Take medicines only as directed by your health care provider.  If you were given a brace, wear it as directed by your health care provider.  Keep all follow-up visits as directed by your health care provider. This is important. Contact a health care provider if:  Your pain does not get better with treatment.  Your pain gets worse.  You have numbness or weakness in your forearm, hand, or fingers. This information is not intended to replace advice given to you by your health care provider. Make sure you discuss any questions you have with your health care provider. Document Released: 05/23/2005 Document Revised: 01/21/2016 Document Reviewed: 05/19/2014 Elsevier Interactive Patient Education  2018 [employer].   Hypertension Hypertension, commonly called high blood pressure, is when the force of blood pumping through the arteries is too strong. The arteries are the blood vessels that carry blood from the heart throughout the body. Hypertension forces the heart to work harder to pump blood and may cause arteries to become narrow or stiff. Having untreated or uncontrolled hypertension can cause heart attacks, strokes, kidney disease, and other problems. A blood pressure reading consists of a higher number over a lower number. Ideally, your blood pressure should be below 120/80. The first ("top") number is called the systolic pressure. It is a measure of the pressure in your arteries as your heart beats. The second ("bottom") number is called the diastolic pressure. It is a measure of the pressure in your arteries as the heart relaxes. What are the causes? The cause of this condition is not known. What increases the risk? Some risk factors for high blood pressure are under your control. Others are not. Factors you can change  Smoking.  Having type 2 diabetes mellitus, high cholesterol, or both.  Not  getting enough exercise or physical activity.  Being overweight.  Having too much fat, sugar, calories, or salt (sodium) in your diet.  Drinking too much alcohol. Factors that are difficult or impossible to change  Having chronic kidney disease.  Having a family history of high blood pressure.  Age. Risk increases with age.  Race. You may be at higher risk if you are African-American.  Gender. Men are at higher risk than women before age 66. After age 63, women are at higher risk than men.  Having obstructive sleep apnea.  Stress. What are the signs or symptoms? Extremely high blood pressure (hypertensive crisis) may cause:  Headache.  Anxiety.  Shortness of breath.  Nosebleed.  Nausea and vomiting.  Severe chest pain.  Jerky movements you cannot control (seizures).  How is this diagnosed? This condition is diagnosed by measuring your blood pressure while you are seated, with your arm resting on a surface. The cuff of the blood pressure monitor will be placed directly against the skin of your upper arm at the level of your heart. It should be measured  at least twice using the same arm. Certain conditions can cause a difference in blood pressure between your right and left arms. Certain factors can cause blood pressure readings to be lower or higher than normal (elevated) for a short period of time:  When your blood pressure is higher when you are in a health care provider's office than when you are at home, this is called white coat hypertension. Most people with this condition do not need medicines.  When your blood pressure is higher at home than when you are in a health care provider's office, this is called masked hypertension. Most people with this condition may need medicines to control blood pressure.  If you have a high blood pressure reading during one visit or you have normal blood pressure with other risk factors:  You may be asked to return on a different  day to have your blood pressure checked again.  You may be asked to monitor your blood pressure at home for 1 week or longer.  If you are diagnosed with hypertension, you may have other blood or imaging tests to help your health care provider understand your overall risk for other conditions. How is this treated? This condition is treated by making healthy lifestyle changes, such as eating healthy foods, exercising more, and reducing your alcohol intake. Your health care provider may prescribe medicine if lifestyle changes are not enough to get your blood pressure under control, and if:  Your systolic blood pressure is above 130.  Your diastolic blood pressure is above 80.  Your personal target blood pressure may vary depending on your medical conditions, your age, and other factors. Follow these instructions at home: Eating and drinking  Eat a diet that is high in fiber and potassium, and low in sodium, added sugar, and fat. An example eating plan is called the DASH (Dietary Approaches to Stop Hypertension) diet. To eat this way: ? Eat plenty of fresh fruits and vegetables. Try to fill half of your plate at each meal with fruits and vegetables. ? Eat whole grains, such as whole wheat pasta, brown rice, or whole grain bread. Fill about one quarter of your plate with whole grains. ? Eat or drink low-fat dairy products, such as skim milk or low-fat yogurt. ? Avoid fatty cuts of meat, processed or cured meats, and poultry with skin. Fill about one quarter of your plate with lean proteins, such as fish, chicken without skin, beans, eggs, and tofu. ? Avoid premade and processed foods. These tend to be higher in sodium, added sugar, and fat.  Reduce your daily sodium intake. Most people with hypertension should eat less than 1,500 mg of sodium a day.  Limit alcohol intake to no more than 1 drink a day for nonpregnant women and 2 drinks a day for men. One drink equals 12 oz of beer, 5 oz of wine,  or 1 oz of hard liquor. Lifestyle  Work with your health care provider to maintain a healthy body weight or to lose weight. Ask what an ideal weight is for you.  Get at least 30 minutes of exercise that causes your heart to beat faster (aerobic exercise) most days of the week. Activities may include walking, swimming, or biking.  Include exercise to strengthen your muscles (resistance exercise), such as pilates or lifting weights, as part of your weekly exercise routine. Try to do these types of exercises for 30 minutes at least 3 days a week.  Do not use any products that  contain nicotine or tobacco, such as cigarettes and e-cigarettes. If you need help quitting, ask your health care provider.  Monitor your blood pressure at home as told by your health care provider.  Keep all follow-up visits as told by your health care provider. This is important. Medicines  Take over-the-counter and prescription medicines only as told by your health care provider. Follow directions carefully. Blood pressure medicines must be taken as prescribed.  Do not skip doses of blood pressure medicine. Doing this puts you at risk for problems and can make the medicine less effective.  Ask your health care provider about side effects or reactions to medicines that you should watch for. Contact a health care provider if:  You think you are having a reaction to a medicine you are taking.  You have headaches that keep coming back (recurring).  You feel dizzy.  You have swelling in your ankles.  You have trouble with your vision. Get help right away if:  You develop a severe headache or confusion.  You have unusual weakness or numbness.  You feel faint.  You have severe pain in your chest or abdomen.  You vomit repeatedly.  You have trouble breathing. Summary  Hypertension is when the force of blood pumping through your arteries is too strong. If this condition is not controlled, it may put you at  risk for serious complications.  Your personal target blood pressure may vary depending on your medical conditions, your age, and other factors. For most people, a normal blood pressure is less than 120/80.  Hypertension is treated with lifestyle changes, medicines, or a combination of both. Lifestyle changes include weight loss, eating a healthy, low-sodium diet, exercising more, and limiting alcohol. This information is not intended to replace advice given to you by your health care provider. Make sure you discuss any questions you have with your health care provider. Document Released: 05/23/2005 Document Revised: 04/20/2016 Document Reviewed: 04/20/2016 Elsevier Interactive Patient Education  2018 [employer].   IF you received an x-ray today, you will receive an invoice from [facility]. Please contact [facility] at [phone] with questions or concerns regarding your invoice.   IF you received labwork today, you will receive an invoice from [facility]. Please contact [facility] at [phone] with questions or concerns regarding your invoice.   Our billing staff will not be able to assist you with questions regarding bills from these companies.  You will be contacted with the lab results as soon as they are available. The fastest way to get your results is to activate your My Chart account. Instructions are located on the last page of this paperwork. If you have not heard from [place] regarding the results in 2 weeks, please contact this office.

## 2017-06-28 LAB — BASIC METABOLIC PANEL
BUN/Creatinine Ratio: 16 (ref 9–23)
BUN: 11 mg/dL (ref 6–24)
CALCIUM: 10.1 mg/dL (ref 8.7–10.2)
CHLORIDE: 95 mmol/L — AB (ref 96–106)
CO2: 25 mmol/L (ref 20–29)
CREATININE: 0.68 mg/dL (ref 0.57–1.00)
GFR calc Af Amer: 120 mL/min/{1.73_m2} (ref >59)
GFR calc non Af Amer: 105 mL/min/{1.73_m2} (ref >59)
GLUCOSE: 150 mg/dL — AB (ref 65–99)
Potassium: 3.8 mmol/L (ref 3.5–5.2)
Sodium: 137 mmol/L (ref 134–144)

## 2017-06-28 LAB — HEMOGLOBIN A1C
Est. average glucose Bld gHb Est-mCnc: 160 mg/dL
Hgb A1c MFr Bld: 7.2 % — ABNORMAL HIGH (ref 4.8–5.6)

## 2017-07-03 ENCOUNTER — Telehealth: Payer: Self-pay | Admitting: Family Medicine

## 2017-07-03 NOTE — Telephone Encounter (Signed)
Result note from Dr. Carlota Raspberry read to patient. Pt will call back to schedule appt. within next few weeks.

## 2017-07-14 ENCOUNTER — Ambulatory Visit (INDEPENDENT_AMBULATORY_CARE_PROVIDER_SITE_OTHER): Payer: BLUE CROSS/BLUE SHIELD | Admitting: Family Medicine

## 2017-07-14 ENCOUNTER — Encounter: Payer: Self-pay | Admitting: Family Medicine

## 2017-07-14 ENCOUNTER — Other Ambulatory Visit: Payer: Self-pay

## 2017-07-14 VITALS — BP 131/87 | HR 99 | Temp 98.3°F | Resp 18 | Ht 63.58 in | Wt 206.8 lb

## 2017-07-14 DIAGNOSIS — J0100 Acute maxillary sinusitis, unspecified: Secondary | ICD-10-CM

## 2017-07-14 DIAGNOSIS — J309 Allergic rhinitis, unspecified: Secondary | ICD-10-CM

## 2017-07-14 MED ORDER — AMOXICILLIN-POT CLAVULANATE 875-125 MG PO TABS: 1.0000 | ORAL_TABLET | Freq: Two times a day (BID) | ORAL | 0 refills | Status: DC

## 2017-07-14 MED ORDER — FLUTICASONE PROPIONATE 50 MCG/ACT NA SUSP: 1.0000 | Freq: Two times a day (BID) | NASAL | 6 refills | Status: DC

## 2017-07-14 NOTE — Patient Instructions (Addendum)
1. Start oral antihistamine (generic for claritin or zyrtec or allegra or brand name xyzal) 2. Start oral decongestant - phenylephrine (should not increase your blood pressure) 3. Start netti pot twice a day, do before flonase 4. Start antibiotic if not getting better in the next several days. Take probiotics while taking antibiotics    IF you received an x-ray today, you will receive an invoice from [facility]. Please contact [facility] at [phone] with questions or concerns regarding your invoice.   IF you received labwork today, you will receive an invoice from [facility]. Please contact [facility] at [phone] with questions or concerns regarding your invoice.   Our billing staff will not be able to assist you with questions regarding bills from these companies.  You will be contacted with the lab results as soon as they are available. The fastest way to get your results is to activate your My Chart account. Instructions are located on the last page of this paperwork. If you have not heard from [place] regarding the results in 2 weeks, please contact this office.     Sinusitis, Adult Sinusitis is soreness and inflammation of your sinuses. Sinuses are hollow spaces in the bones around your face. Your sinuses are located:  Around your eyes.  In the middle of your forehead.  Behind your nose.  In your cheekbones.  Your sinuses and nasal passages are lined with a stringy fluid (mucus). Mucus normally drains out of your sinuses. When your nasal tissues become inflamed or swollen, the mucus can become trapped or blocked so air cannot flow through your sinuses. This allows bacteria, viruses, and funguses to grow, which leads to infection. Sinusitis can develop quickly and last for 7?10 days (acute) or for more than 12 weeks (chronic). Sinusitis often develops after a cold. What are the causes? This condition is caused by anything that creates swelling in the sinuses or  stops mucus from draining, including:  Allergies.  Asthma.  Bacterial or viral infection.  Abnormally shaped bones between the nasal passages.  Nasal growths that contain mucus (nasal polyps).  Narrow sinus openings.  Pollutants, such as chemicals or irritants in the air.  A foreign object stuck in the nose.  A fungal infection. This is rare.  What increases the risk? The following factors may make you more likely to develop this condition:  Having allergies or asthma.  Having had a recent cold or respiratory tract infection.  Having structural deformities or blockages in your nose or sinuses.  Having a weak immune system.  Doing a lot of swimming or diving.  Overusing nasal sprays.  Smoking.  What are the signs or symptoms? The main symptoms of this condition are pain and a feeling of pressure around the affected sinuses. Other symptoms include:  Upper toothache.  Earache.  Headache.  Bad breath.  Decreased sense of smell and taste.  A cough that may get worse at night.  Fatigue.  Fever.  Thick drainage from your nose. The drainage is often green and it may contain pus (purulent).  Stuffy nose or congestion.  Postnasal drip. This is when extra mucus collects in the throat or back of the nose.  Swelling and warmth over the affected sinuses.  Sore throat.  Sensitivity to light.  How is this diagnosed? This condition is diagnosed based on symptoms, a medical history, and a physical exam. To find out if your condition is acute or chronic, your health care provider may:  Look in your nose for  signs of nasal polyps.  Tap over the affected sinus to check for signs of infection.  View the inside of your sinuses using an imaging device that has a light attached (endoscope).  If your health care provider suspects that you have chronic sinusitis, you may also:  Be tested for allergies.  Have a sample of mucus taken from your nose (nasal culture)  and checked for bacteria.  Have a mucus sample examined to see if your sinusitis is related to an allergy.  If your sinusitis does not respond to treatment and it lasts longer than 8 weeks, you may have an MRI or CT scan to check your sinuses. These scans also help to determine how severe your infection is. In rare cases, a bone biopsy may be done to rule out more serious types of fungal sinus disease. How is this treated? Treatment for sinusitis depends on the cause and whether your condition is chronic or acute. If a virus is causing your sinusitis, your symptoms will go away on their own within 10 days. You may be given medicines to relieve your symptoms, including:  Topical nasal decongestants. They shrink swollen nasal passages and let mucus drain from your sinuses.  Antihistamines. These drugs block inflammation that is triggered by allergies. This can help to ease swelling in your nose and sinuses.  Topical nasal corticosteroids. These are nasal sprays that ease inflammation and swelling in your nose and sinuses.  Nasal saline washes. These rinses can help to get rid of thick mucus in your nose.  If your condition is caused by bacteria, you will be given an antibiotic medicine. If your condition is caused by a fungus, you will be given an antifungal medicine. Surgery may be needed to correct underlying conditions, such as narrow nasal passages. Surgery may also be needed to remove polyps. Follow these instructions at home: Medicines  Take, use, or apply over-the-counter and prescription medicines only as told by your health care provider. These may include nasal sprays.  If you were prescribed an antibiotic medicine, take it as told by your health care provider. Do not stop taking the antibiotic even if you start to feel better. Hydrate and Humidify  Drink enough water to keep your urine clear or pale yellow. Staying hydrated will help to thin your mucus.  Use a cool mist  humidifier to keep the humidity level in your home above 50%.  Inhale steam for 10-15 minutes, 3-4 times a day or as told by your health care provider. You can do this in the bathroom while a hot shower is running.  Limit your exposure to cool or dry air. Rest  Rest as much as possible.  Sleep with your head raised (elevated).  Make sure to get enough sleep each night. General instructions  Apply a warm, moist washcloth to your face 3-4 times a day or as told by your health care provider. This will help with discomfort.  Wash your hands often with soap and water to reduce your exposure to viruses and other germs. If soap and water are not available, use hand sanitizer.  Do not smoke. Avoid being around people who are smoking (secondhand smoke).  Keep all follow-up visits as told by your health care provider. This is important. Contact a health care provider if:  You have a fever.  Your symptoms get worse.  Your symptoms do not improve within 10 days. Get help right away if:  You have a severe headache.  You have persistent  vomiting.  You have pain or swelling around your face or eyes.  You have vision problems.  You develop confusion.  Your neck is stiff.  You have trouble breathing. This information is not intended to replace advice given to you by your health care provider. Make sure you discuss any questions you have with your health care provider. Document Released: 05/23/2005 Document Revised: 01/17/2016 Document Reviewed: 03/18/2015 Elsevier Interactive Patient Education  [employer].

## 2017-07-14 NOTE — Progress Notes (Signed)
2/8/20193:16 PM  [name] 11/11/1970, 47 y.o. female [id number]  Chief Complaint  Patient presents with  . Nasal Congestion    X 3 weeks  . Facial Pain    X 2-3 weeks with ear pain om right    HPI:   Patient is a 47 y.o. female with past medical history significant for HTN who presents today for 3 weeks of clear rhinorrhea, nasal congestion, ear pressure, sinus pressure, sneezing, itchy ears. Denies any fever or chills, sore throat, productive cough or SOB. Denies any nausea, vomiting or diarrhea. She has been doing theraflu and mucinex.  Depression screen [facility] 2/9 07/14/2017 06/27/2017 12/27/2016  Decreased Interest 0 0 0  Down, Depressed, Hopeless 0 0 0  PHQ - 2 Score 0 0 0  Some encounter information is confidential and restricted. Go to Review Flowsheets activity to see all data.    Allergies  Allergen Reactions  . Ace Inhibitors Cough    She did have borderline swelling to the tip of her uvula.    Prior to Admission medications   Medication Sig Start Date End Date Taking? Authorizing Provider  amLODipine (NORVASC) 2.5 MG tablet Take 1 tablet (2.5 mg total) by mouth daily. 06/27/17  Yes Wendie Agreste, MD  hydrochlorothiazide (HYDRODIURIL) 12.5 MG tablet Take 1 tablet (12.5 mg total) by mouth daily. 06/27/17  Yes Wendie Agreste, MD  ibuprofen (ADVIL,MOTRIN) 800 MG tablet Take 1 tablet (800 mg total) by mouth every 8 (eight) hours as needed for mild pain. 03/22/14  Yes Drenda Freeze, MD  cloNIDine (CATAPRES) 0.1 MG tablet TAKE 1 TABLET BY MOUTH TWICE A DAY Patient not taking: Reported on 07/14/2017 05/12/17   Wendie Agreste, MD  diclofenac (VOLTAREN) 75 MG EC tablet Take 1 tablet (75 mg total) by mouth 2 (two) times daily. Patient not taking: Reported on 07/14/2017 12/27/16   Wendie Agreste, MD    Past Medical History:  Diagnosis Date  . Anxiety   . Asthma    IN PAST NO RECENT PROBLEMS  . Blood clot in vein 2008   right leg  . Depression   .  Epigastric pain   . Gallstones   . Headache(784.0)    Migraines  . Heartburn   . Hypertension   . Iron deficiency anemia     Past Surgical History:  Procedure Laterality Date  . CHOLECYSTECTOMY  10/05/2011   Procedure: LAPAROSCOPIC CHOLECYSTECTOMY WITH INTRAOPERATIVE CHOLANGIOGRAM;  Surgeon: Pedro Earls, MD;  Location: [facility];  Service: General;  Laterality: N/A;  . COLONOSCOPY    . ROBOTIC ASSISTED TOTAL HYSTERECTOMY WITH SALPINGECTOMY Bilateral 10/12/2015   Procedure: ROBOTIC ASSISTED TOTAL HYSTERECTOMY WITH SALPINGECTOMY;  Surgeon: Brien Few, MD;  Location: [facility];  Service: Gynecology;  Laterality: Bilateral;  . TUBAL LIGATION    . TUBAL REVERSAL      Social History   Tobacco Use  . Smoking status: Never Smoker  . Smokeless tobacco: Never Used  Substance Use Topics  . Alcohol use: Yes    Comment: RARE    Family History  Problem Relation Age of Onset  . Hypertension Mother   . Diabetes Mother   . Cancer Father   . Diabetes Unknown        Family History  . Colon cancer Neg Hx     ROS Per hpi  OBJECTIVE:  Blood pressure 131/87, pulse 99, temperature 98.3 F (36.8 C), temperature source Oral, resp. rate 18, height 5' 3.58" (1.615 m), weight  206 lb 12.8 oz (93.8 kg), last menstrual period 09/18/2015, SpO2 98 %.  Physical Exam  Constitutional: She is oriented to person, place, and time and well-developed, well-nourished, and in no distress.  HENT:  Head: Normocephalic and atraumatic.  Right Ear: Hearing, tympanic membrane, external ear and ear canal normal.  Left Ear: Hearing, tympanic membrane, external ear and ear canal normal.  Mouth/Throat: Oropharynx is clear and moist.  Eyes: EOM are normal. Pupils are equal, round, and reactive to light.  Neck: Neck supple.  Cardiovascular: Normal rate, regular rhythm and normal heart sounds. Exam reveals no gallop and no friction rub.  No murmur heard. Pulmonary/Chest: Effort normal and breath sounds normal. She  has no wheezes. She has no rales.  Lymphadenopathy:    She has no cervical adenopathy.  Neurological: She is alert and oriented to person, place, and time. Gait normal.  Skin: Skin is warm and dry.     ASSESSMENT and PLAN  1. Acute non-recurrent maxillary sinusitis 2. Allergic rhinitis, unspecified seasonality, unspecified trigger Discussed supportive measures, new meds r/se/b and RTC precautions. Patient educational handout given. Other orders - fluticasone (FLONASE) 50 MCG/ACT nasal spray; Place 1 spray into both nostrils 2 (two) times daily. - amoxicillin-clavulanate (AUGMENTIN) 875-125 MG tablet; Take 1 tablet by mouth 2 (two) times daily.  Return if symptoms worsen or fail to improve.    Rutherford Guys, MD Primary Care at [place], [place] [address] Ph.  [phone] Fax [phone]

## 2017-07-21 DIAGNOSIS — Z1231 Encounter for screening mammogram for malignant neoplasm of breast: Secondary | ICD-10-CM | POA: Diagnosis not present

## 2017-08-14 DIAGNOSIS — Z6838 Body mass index (BMI) 38.0-38.9, adult: Secondary | ICD-10-CM | POA: Diagnosis not present

## 2017-08-14 DIAGNOSIS — Z01419 Encounter for gynecological examination (general) (routine) without abnormal findings: Secondary | ICD-10-CM | POA: Diagnosis not present

## 2017-09-05 ENCOUNTER — Encounter: Payer: Self-pay | Admitting: Physician Assistant

## 2017-09-17 ENCOUNTER — Emergency Department (HOSPITAL_COMMUNITY)
Admission: EM | Admit: 2017-09-17 | Discharge: 2017-09-17 | Disposition: A | Discharge status: Home / Self Care | Payer: BLUE CROSS/BLUE SHIELD | Attending: Emergency Medicine | Admitting: Emergency Medicine

## 2017-09-17 ENCOUNTER — Emergency Department (HOSPITAL_COMMUNITY): Payer: BLUE CROSS/BLUE SHIELD

## 2017-09-17 ENCOUNTER — Emergency Department (HOSPITAL_BASED_OUTPATIENT_CLINIC_OR_DEPARTMENT_OTHER)
Admit: 2017-09-17 | Discharge: 2017-09-17 | Disposition: A | Discharge status: Home / Self Care | Payer: BLUE CROSS/BLUE SHIELD | Attending: Emergency Medicine | Admitting: Emergency Medicine

## 2017-09-17 ENCOUNTER — Encounter (HOSPITAL_COMMUNITY): Payer: Self-pay | Admitting: Emergency Medicine

## 2017-09-17 DIAGNOSIS — I10 Essential (primary) hypertension: Secondary | ICD-10-CM | POA: Diagnosis not present

## 2017-09-17 DIAGNOSIS — Y929 Unspecified place or not applicable: Secondary | ICD-10-CM | POA: Diagnosis not present

## 2017-09-17 DIAGNOSIS — W228XXA Striking against or struck by other objects, initial encounter: Secondary | ICD-10-CM | POA: Diagnosis not present

## 2017-09-17 DIAGNOSIS — R2241 Localized swelling, mass and lump, right lower limb: Secondary | ICD-10-CM | POA: Insufficient documentation

## 2017-09-17 DIAGNOSIS — T148XXA Other injury of unspecified body region, initial encounter: Secondary | ICD-10-CM

## 2017-09-17 DIAGNOSIS — Y939 Activity, unspecified: Secondary | ICD-10-CM | POA: Insufficient documentation

## 2017-09-17 DIAGNOSIS — S99911A Unspecified injury of right ankle, initial encounter: Secondary | ICD-10-CM | POA: Diagnosis not present

## 2017-09-17 DIAGNOSIS — Z79899 Other long term (current) drug therapy: Secondary | ICD-10-CM | POA: Insufficient documentation

## 2017-09-17 DIAGNOSIS — S86911A Strain of unspecified muscle(s) and tendon(s) at lower leg level, right leg, initial encounter: Secondary | ICD-10-CM | POA: Diagnosis not present

## 2017-09-17 DIAGNOSIS — M7989 Other specified soft tissue disorders: Secondary | ICD-10-CM

## 2017-09-17 DIAGNOSIS — M79609 Pain in unspecified limb: Secondary | ICD-10-CM

## 2017-09-17 DIAGNOSIS — M25571 Pain in right ankle and joints of right foot: Secondary | ICD-10-CM | POA: Diagnosis not present

## 2017-09-17 DIAGNOSIS — S8991XA Unspecified injury of right lower leg, initial encounter: Secondary | ICD-10-CM | POA: Diagnosis not present

## 2017-09-17 DIAGNOSIS — Y990 Civilian activity done for income or pay: Secondary | ICD-10-CM | POA: Insufficient documentation

## 2017-09-17 MED ORDER — KETOROLAC TROMETHAMINE 60 MG/2ML IM SOLN
30.0000 mg | Freq: Once | INTRAMUSCULAR | Status: AC
  Administered 2017-09-17: 30 mg via INTRAMUSCULAR
  Filled 2017-09-17: qty 2

## 2017-09-17 MED ORDER — METHOCARBAMOL 500 MG PO TABS: 500.0000 mg | ORAL_TABLET | Freq: Two times a day (BID) | ORAL | 0 refills | Status: DC

## 2017-09-17 MED ORDER — HYDROCODONE-ACETAMINOPHEN 5-325 MG PO TABS: 1.0000 | ORAL_TABLET | ORAL | 0 refills | Status: DC | PRN

## 2017-09-17 NOTE — Progress Notes (Signed)
VASCULAR LAB PRELIMINARY  PRELIMINARY  PRELIMINARY  PRELIMINARY  Right lower extremity venous duplex completed.    Preliminary report:  There is no DVT or SVT noted in the right lower extremity.  Gave results to Dr. Loney Laurence, [facility], RVT 09/17/2017, 6:40 PM

## 2017-09-17 NOTE — ED Triage Notes (Signed)
Pt reports right leg got hit Friday with hoyer lift. Went home took OTC meds, soaking, elevation but RLE swelling and very painful. Was seen at [facility] and sent here to r/o DVT. Pt reports that right RLE does feel warm to touch.

## 2017-09-17 NOTE — ED Provider Notes (Signed)
[facility] DEPT Provider Note   CSN: [id number] Arrival date & time: 09/17/17  1544     History   Chief Complaint Chief Complaint  Patient presents with  . sent from [facility] to r/o DVT  . Leg Swelling    HPI [name] is a 47 y.o. female.  47 year old female presents with acute onset of right ankle pain after he was struck by an object at work.  Pain is sharp and worse with standing.  She has some radiation up into her right calf with some associated swelling.  Denies any PE symptoms.  Went to urgent care center who was concerned about possible DVT and sent her here for an ultrasound.     Past Medical History:  Diagnosis Date  . Anxiety   . Asthma    IN PAST NO RECENT PROBLEMS  . Blood clot in vein 2008   right leg  . Depression   . Epigastric pain   . Gallstones   . Headache(784.0)    Migraines  . Heartburn   . Hypertension   . Iron deficiency anemia     Patient Active Problem List   Diagnosis Date Noted  . Environmental allergies 11/04/2015  . Fibroids 10/12/2015  . Essential hypertension 03/19/2015  . Chronic cholecystitis 09/08/2011  . IRON DEFICIENCY 10/22/2009  . ABDOMINAL PAIN, LEFT UPPER QUADRANT 07/22/2009  . ESOPHAGEAL REFLUX 04/17/2009    Past Surgical History:  Procedure Laterality Date  . CHOLECYSTECTOMY  10/05/2011   Procedure: LAPAROSCOPIC CHOLECYSTECTOMY WITH INTRAOPERATIVE CHOLANGIOGRAM;  Surgeon: Pedro Earls, MD;  Location: [facility];  Service: General;  Laterality: N/A;  . COLONOSCOPY    . ROBOTIC ASSISTED TOTAL HYSTERECTOMY WITH SALPINGECTOMY Bilateral 10/12/2015   Procedure: ROBOTIC ASSISTED TOTAL HYSTERECTOMY WITH SALPINGECTOMY;  Surgeon: Brien Few, MD;  Location: [facility];  Service: Gynecology;  Laterality: Bilateral;  . TUBAL LIGATION    . TUBAL REVERSAL       OB History   None      Home Medications    Prior to Admission medications   Medication Sig Start Date End Date Taking?  Authorizing Provider  amLODipine (NORVASC) 2.5 MG tablet Take 1 tablet (2.5 mg total) by mouth daily. 06/27/17   Wendie Agreste, MD  amoxicillin-clavulanate (AUGMENTIN) 875-125 MG tablet Take 1 tablet by mouth 2 (two) times daily. 07/14/17   Rutherford Guys, MD  cetirizine (ZYRTEC) 10 MG tablet Take 10 mg by mouth daily as needed for allergies. Reported on 11/04/2015    [provider]  cloNIDine (CATAPRES) 0.1 MG tablet TAKE 1 TABLET BY MOUTH TWICE A DAY Patient not taking: Reported on 07/14/2017 05/12/17   Wendie Agreste, MD  diclofenac (VOLTAREN) 75 MG EC tablet Take 1 tablet (75 mg total) by mouth 2 (two) times daily. Patient not taking: Reported on 07/14/2017 12/27/16   Wendie Agreste, MD  fluticasone [facility]) 50 MCG/ACT nasal spray Place 1 spray into both nostrils 2 (two) times daily. 07/14/17   Rutherford Guys, MD  hydrochlorothiazide (HYDRODIURIL) 12.5 MG tablet Take 1 tablet (12.5 mg total) by mouth daily. 06/27/17   Wendie Agreste, MD  ibuprofen (ADVIL,MOTRIN) 800 MG tablet Take 1 tablet (800 mg total) by mouth every 8 (eight) hours as needed for mild pain. 03/22/14   Drenda Freeze, MD    Family History Family History  Problem Relation Age of Onset  . Hypertension Mother   . Diabetes Mother   . Cancer Father   .  Diabetes Unknown        Family History  . Colon cancer Neg Hx     Social History Social History   Tobacco Use  . Smoking status: Never Smoker  . Smokeless tobacco: Never Used  Substance Use Topics  . Alcohol use: Yes    Comment: RARE  . Drug use: No     Allergies   Ace inhibitors   Review of Systems Review of Systems  All other systems reviewed and are negative.    Physical Exam Updated Vital Signs BP (!) 131/96 (BP Location: Left Arm)   Pulse 93   Temp 98.8 F (37.1 C) (Oral)   Resp 18   Ht 1.549 m (5\' 1" )   Wt 94.3 kg (208 lb)   LMP 09/18/2015   SpO2 100%   BMI 39.30 kg/m   Physical Exam  Constitutional: She is oriented  to person, place, and time. She appears well-developed and well-nourished.  Non-toxic appearance. No distress.  HENT:  Head: Normocephalic and atraumatic.  Eyes: Pupils are equal, round, and reactive to light. Conjunctivae, EOM and lids are normal.  Neck: Normal range of motion. Neck supple. No tracheal deviation present. No thyroid mass present.  Cardiovascular: Normal rate, regular rhythm and normal heart sounds. Exam reveals no gallop.  No murmur heard. Pulmonary/Chest: Effort normal and breath sounds normal. No stridor. No respiratory distress. She has no decreased breath sounds. She has no wheezes. She has no rhonchi. She has no rales.  Abdominal: Soft. Normal appearance and bowel sounds are normal. She exhibits no distension. There is no tenderness. There is no rebound and no CVA tenderness.  Musculoskeletal: Normal range of motion. She exhibits no edema or tenderness.       Feet:  Neurological: She is alert and oriented to person, place, and time. She has normal strength. No cranial nerve deficit or sensory deficit. GCS eye subscore is 4. GCS verbal subscore is 5. GCS motor subscore is 6.  Skin: Skin is warm and dry. No abrasion and no rash noted.  Psychiatric: She has a normal mood and affect. Her speech is normal and behavior is normal.  Nursing note and vitals reviewed.    ED Treatments / Results  Labs (all labs ordered are listed, but only abnormal results are displayed) Labs Reviewed - No data to display  EKG None  Radiology No results found.  Procedures Procedures (including critical care time)  Medications Ordered in ED Medications  ketorolac (TORADOL) injection 30 mg (has no administration in time range)     Initial Impression / Assessment and Plan / ED Course  I have reviewed the triage vital signs and the nursing notes.  Pertinent labs & imaging results that were available during my care of the patient were reviewed by me and considered in my medical  decision making (see chart for details).    X-rays of her lower extremity are negative.  She also has a negative Doppler lower extremity.  Will prescribe analgesics and muscle relaxants and discharged home   Final Clinical Impressions(s) / ED Diagnoses   Final diagnoses:  None    ED Discharge Orders    None       Lacretia Leigh, MD 09/17/17 2012

## 2017-09-17 NOTE — Discharge Instructions (Addendum)
Use ibuprofen as directed.

## 2017-09-22 ENCOUNTER — Other Ambulatory Visit: Payer: Self-pay | Admitting: Family Medicine

## 2017-09-22 DIAGNOSIS — I10 Essential (primary) hypertension: Secondary | ICD-10-CM

## 2017-12-21 ENCOUNTER — Other Ambulatory Visit: Payer: Self-pay | Admitting: Family Medicine

## 2017-12-21 DIAGNOSIS — I10 Essential (primary) hypertension: Secondary | ICD-10-CM

## 2018-03-18 ENCOUNTER — Other Ambulatory Visit: Payer: Self-pay | Admitting: Family Medicine

## 2018-03-18 DIAGNOSIS — I10 Essential (primary) hypertension: Secondary | ICD-10-CM

## 2018-03-19 NOTE — Telephone Encounter (Signed)
Appt made for 04/16/18 with Dr. Pamella Pert

## 2018-04-16 ENCOUNTER — Ambulatory Visit (INDEPENDENT_AMBULATORY_CARE_PROVIDER_SITE_OTHER): Payer: BLUE CROSS/BLUE SHIELD | Admitting: Family Medicine

## 2018-04-16 ENCOUNTER — Other Ambulatory Visit: Payer: Self-pay

## 2018-04-16 ENCOUNTER — Encounter: Payer: Self-pay | Admitting: Family Medicine

## 2018-04-16 VITALS — BP 148/93 | HR 81 | Temp 98.0°F | Ht 61.0 in | Wt 211.0 lb

## 2018-04-16 DIAGNOSIS — E669 Obesity, unspecified: Secondary | ICD-10-CM | POA: Diagnosis not present

## 2018-04-16 DIAGNOSIS — M1711 Unilateral primary osteoarthritis, right knee: Secondary | ICD-10-CM | POA: Diagnosis not present

## 2018-04-16 DIAGNOSIS — Z6839 Body mass index (BMI) 39.0-39.9, adult: Secondary | ICD-10-CM

## 2018-04-16 DIAGNOSIS — I10 Essential (primary) hypertension: Secondary | ICD-10-CM | POA: Diagnosis not present

## 2018-04-16 DIAGNOSIS — E119 Type 2 diabetes mellitus without complications: Secondary | ICD-10-CM

## 2018-04-16 DIAGNOSIS — E66812 Obesity, class 2: Secondary | ICD-10-CM

## 2018-04-16 LAB — POCT GLYCOSYLATED HEMOGLOBIN (HGB A1C): Hemoglobin A1C: 7.6 % — AB (ref 4.0–5.6)

## 2018-04-16 MED ORDER — IBUPROFEN 800 MG PO TABS: 800.0000 mg | ORAL_TABLET | Freq: Three times a day (TID) | ORAL | 0 refills | Status: DC | PRN

## 2018-04-16 MED ORDER — AMLODIPINE BESYLATE 2.5 MG PO TABS: 2.5000 mg | ORAL_TABLET | Freq: Every day | ORAL | 2 refills | Status: DC

## 2018-04-16 MED ORDER — DAPAGLIFLOZIN PROPANEDIOL 10 MG PO TABS: 10.0000 mg | ORAL_TABLET | Freq: Every day | ORAL | 0 refills | Status: DC

## 2018-04-16 NOTE — Patient Instructions (Signed)
° ° ° °  If you have lab work done today you will be contacted with your lab results within the next 2 weeks.  If you have not heard from us then please contact us. The fastest way to get your results is to register for My Chart. ° ° °IF you received an x-ray today, you will receive an invoice from [facility]. Please contact  Radiology at [phone] with questions or concerns regarding your invoice.  ° °IF you received labwork today, you will receive an invoice from [facility]. Please contact [facility] at [phone] with questions or concerns regarding your invoice.  ° °Our billing staff will not be able to assist you with questions regarding bills from these companies. ° °You will be contacted with the lab results as soon as they are available. The fastest way to get your results is to activate your My Chart account. Instructions are located on the last page of this paperwork. If you have not heard from us regarding the results in 2 weeks, please contact this office. °  ° ° ° °

## 2018-04-16 NOTE — Progress Notes (Signed)
11/11/201910:29 AM  [name] [birth year], 47 y.o. female [id number]  Chief Complaint  Patient presents with  . Medication Refill    norco for the pain in the right knee, norvasc and hydroduiril. Monitors bp daily has been up    HPI:   Patient is a 47 y.o. female with past medical history significant for HTN, chronic right knee pain, hyperglycemia who presents today for routine followup  Last appt for routine care in Jan 2019, Dr Carlota Raspberry A1c at that visit 7.2 Normal crt  Right knee xray in 2017 - DJD Has never had injections Does not hurt daily, some morning stiffness ibu was helping Has been recently taking BC powder Starting to bother her back at night  Checks BP at home and work Takes her BP meds as prescribed 140/80s Waking up with headache, needs to take tylenol or ibu Does snore but headache did not start until her BP started to raise Drinks about 2 beers a night   Fall Risk  04/16/2018 07/14/2017 06/27/2017 12/27/2016  Falls in the past year? 0 No No No  Some encounter information is confidential and restricted. Go to Review Flowsheets activity to see all data.     Depression screen [facility] 2/9 07/14/2017 06/27/2017 12/27/2016  Decreased Interest 0 0 0  Down, Depressed, Hopeless 0 0 0  PHQ - 2 Score 0 0 0  Some encounter information is confidential and restricted. Go to Review Flowsheets activity to see all data.    Allergies  Allergen Reactions  . Ace Inhibitors Cough    She did have borderline swelling to the tip of her uvula.    Prior to Admission medications   Medication Sig Start Date End Date Taking? Authorizing Provider  amLODipine (NORVASC) 2.5 MG tablet TAKE 1 TABLET BY MOUTH EVERY DAY 03/19/18  Yes Wendie Agreste, MD  hydrochlorothiazide (HYDRODIURIL) 12.5 MG tablet TAKE 1 TABLET BY MOUTH EVERY DAY 12/22/17  Yes Wendie Agreste, MD  ibuprofen (ADVIL,MOTRIN) 800 MG tablet Take 1 tablet (800 mg total) by mouth every 8 (eight) hours as needed for  mild pain. 03/22/14  Yes Drenda Freeze, MD  methocarbamol (ROBAXIN) 500 MG tablet Take 1 tablet (500 mg total) by mouth 2 (two) times daily. 09/17/17  Yes Lacretia Leigh, MD  HYDROcodone-acetaminophen (NORCO/VICODIN) 5-325 MG tablet Take 1-2 tablets by mouth every 4 (four) hours as needed. Patient not taking: Reported on 04/16/2018 09/17/17   Lacretia Leigh, MD    Past Medical History:  Diagnosis Date  . Anxiety   . Asthma    IN PAST NO RECENT PROBLEMS  . Blood clot in vein 2008   right leg  . Depression   . Epigastric pain   . Gallstones   . Headache(784.0)    Migraines  . Heartburn   . Hypertension   . Iron deficiency anemia     Past Surgical History:  Procedure Laterality Date  . CHOLECYSTECTOMY  10/05/2011   Procedure: LAPAROSCOPIC CHOLECYSTECTOMY WITH INTRAOPERATIVE CHOLANGIOGRAM;  Surgeon: Pedro Earls, MD;  Location: [facility];  Service: General;  Laterality: N/A;  . COLONOSCOPY    . ROBOTIC ASSISTED TOTAL HYSTERECTOMY WITH SALPINGECTOMY Bilateral 10/12/2015   Procedure: ROBOTIC ASSISTED TOTAL HYSTERECTOMY WITH SALPINGECTOMY;  Surgeon: Brien Few, MD;  Location: [facility];  Service: Gynecology;  Laterality: Bilateral;  . TUBAL LIGATION    . TUBAL REVERSAL      Social History   Tobacco Use  . Smoking status: Never Smoker  . Smokeless tobacco:  Never Used  Substance Use Topics  . Alcohol use: Yes    Comment: RARE    Family History  Problem Relation Age of Onset  . Hypertension Mother   . Diabetes Mother   . Cancer Father   . Diabetes Unknown        Family History  . Colon cancer Neg Hx     Review of Systems  Constitutional: Negative for chills and fever.  Respiratory: Negative for cough and shortness of breath.   Cardiovascular: Negative for chest pain, palpitations and leg swelling.  Gastrointestinal: Negative for abdominal pain, nausea and vomiting.  Neurological: Positive for tingling (RUE, last fingers, when she wakes up, sleeps on her side) and  headaches.  Psychiatric/Behavioral: The patient does not have insomnia.     OBJECTIVE:  Blood pressure (!) 148/93, pulse 81, temperature 98 F (36.7 C), temperature source Oral, height 5' 1"  (1.549 m), weight 211 lb (95.7 kg), last menstrual period 09/18/2015, SpO2 97 %. Body mass index is 39.87 kg/m.   BP Readings from Last 3 Encounters:  04/16/18 (!) 148/93  09/17/17 (!) 137/95  07/14/17 131/87   Wt Readings from Last 3 Encounters:  04/16/18 211 lb (95.7 kg)  09/17/17 208 lb (94.3 kg)  07/14/17 206 lb 12.8 oz (93.8 kg)    Physical Exam  Constitutional: She is oriented to person, place, and time. She appears well-developed and well-nourished.  HENT:  Head: Normocephalic and atraumatic.  Mouth/Throat: Oropharynx is clear and moist. No oropharyngeal exudate.  Eyes: Pupils are equal, round, and reactive to light. Conjunctivae and EOM are normal. No scleral icterus.  Neck: Neck supple.  Cardiovascular: Normal rate, regular rhythm and normal heart sounds. Exam reveals no gallop and no friction rub.  No murmur heard. Pulmonary/Chest: Effort normal and breath sounds normal. She has no wheezes. She has no rales.  Musculoskeletal: She exhibits no edema.  Neurological: She is alert and oriented to person, place, and time.  Skin: Skin is warm and dry.  Psychiatric: She has a normal mood and affect.  Nursing note and vitals reviewed.     Results for orders placed or performed in visit on 04/16/18 (from the past 24 hour(s))  POCT glycosylated hemoglobin (Hb A1C)     Status: Abnormal   Collection Time: 04/16/18 11:04 AM  Result Value Ref Range   Hemoglobin A1C 7.6 (A) 4.0 - 5.6 %   HbA1c POC (<> result, manual entry)     HbA1c, POC (prediabetic range)     HbA1c, POC (controlled diabetic range)      ASSESSMENT and PLAN  1. Essential hypertension Slightly above goal. Adding sglt2 which should help. Discussed LFM. Recheck at next visit. Consider increasing amlodipine to 6m  daily. - Lipid panel - TSH - CMP14+EGFR - POCT glycosylated hemoglobin (Hb A1C) - amLODipine (NORVASC) 2.5 MG tablet; Take 1 tablet (2.5 mg total) by mouth daily.  2. Diabetes mellitus type II, non insulin dependent (HCC) Stable. Discussed treatment options r/se/b. Starting sglt2, hoping it will help cbgs, BP, weight.   3. Class 2 obesity without serious comorbidity with body mass index (BMI) of 39.0 to 39.9 in adult, unspecified obesity type Discussed importance of low carb diet, regular exercise and healthy weight.   4. Osteoarthritis of right knee, unspecified osteoarthritis type Refill ibuprofen for prn use. Denied vicodin. Weight loss and regular low impact exercise. Consider injections.  Other orders - ibuprofen (ADVIL,MOTRIN) 800 MG tablet; Take 1 tablet (800 mg total) by mouth every 8 (eight)  hours as needed for mild pain. - dapagliflozin propanediol (FARXIGA) 10 MG TABS tablet; Take 10 mg by mouth daily.  Return in about 3 months (around 07/17/2018) for sugars, blood pressure, weight.    Rutherford Guys, MD Primary Care at [place], [place] [address] Ph.  [phone] Fax [phone]

## 2018-04-17 LAB — CMP14+EGFR
ALT: 23 IU/L (ref 0–32)
AST: 21 IU/L (ref 0–40)
Albumin/Globulin Ratio: 1.4 (ref 1.2–2.2)
Albumin: 4.3 g/dL (ref 3.5–5.5)
Alkaline Phosphatase: 69 IU/L (ref 39–117)
BUN/Creatinine Ratio: 13 (ref 9–23)
BUN: 9 mg/dL (ref 6–24)
Bilirubin Total: 0.4 mg/dL (ref 0.0–1.2)
CO2: 23 mmol/L (ref 20–29)
Calcium: 9.5 mg/dL (ref 8.7–10.2)
Chloride: 101 mmol/L (ref 96–106)
Creatinine, Ser: 0.67 mg/dL (ref 0.57–1.00)
GFR calc Af Amer: 121 mL/min/{1.73_m2} (ref >59)
GFR calc non Af Amer: 105 mL/min/{1.73_m2} (ref >59)
Globulin, Total: 3.1 g/dL (ref 1.5–4.5)
Glucose: 127 mg/dL — ABNORMAL HIGH (ref 65–99)
Potassium: 4 mmol/L (ref 3.5–5.2)
Sodium: 142 mmol/L (ref 134–144)
Total Protein: 7.4 g/dL (ref 6.0–8.5)

## 2018-04-17 LAB — LIPID PANEL
Chol/HDL Ratio: 4.1 ratio (ref 0.0–4.4)
Cholesterol, Total: 174 mg/dL (ref 100–199)
HDL: 42 mg/dL (ref >39)
LDL Calculated: 95 mg/dL (ref 0–99)
Triglycerides: 184 mg/dL — ABNORMAL HIGH (ref 0–149)
VLDL Cholesterol Cal: 37 mg/dL (ref 5–40)

## 2018-04-17 LAB — TSH: TSH: 1.3 u[IU]/mL (ref 0.450–4.500)

## 2018-04-23 ENCOUNTER — Encounter: Payer: Self-pay | Admitting: *Deleted

## 2018-05-08 ENCOUNTER — Other Ambulatory Visit: Payer: Self-pay | Admitting: Family Medicine

## 2018-07-09 ENCOUNTER — Other Ambulatory Visit: Payer: Self-pay | Admitting: Family Medicine

## 2018-07-09 NOTE — Telephone Encounter (Signed)
Per OV notes written 04/16/18 pt was to return in 3 months Called pt and made appointment 08/17/18 with PCP. Offered to pt to look for an earlier appointment with another provider and pt refused. Pt given enough medication to get to appointment Requested Prescriptions  Pending Prescriptions Disp Refills  . FARXIGA 10 MG TABS tablet [Pharmacy Med Name: FARXIGA 10 MG TABLET] 90 tablet 0    Sig: TAKE 1 TABLET BY MOUTH EVERY DAY     Endocrinology:  Diabetes - SGLT2 Inhibitors Passed - 07/09/2018  2:00 AM      Passed - Cr in normal range and within 360 days    Creat  Date Value Ref Range Status  03/23/2015 0.64 0.50 - 1.10 mg/dL Final   Creatinine, Ser  Date Value Ref Range Status  04/16/2018 0.67 0.57 - 1.00 mg/dL Final         Passed - LDL in normal range and within 360 days    LDL Calculated  Date Value Ref Range Status  04/16/2018 95 0 - 99 mg/dL Final         Passed - HBA1C is between 0 and 7.9 and within 180 days    Hemoglobin A1C  Date Value Ref Range Status  04/16/2018 7.6 (A) 4.0 - 5.6 % Final   Hgb A1c MFr Bld  Date Value Ref Range Status  06/27/2017 7.2 (H) 4.8 - 5.6 % Final    Comment:             Prediabetes: 5.7 - 6.4          Diabetes: >6.4          Glycemic control for adults with diabetes: <7.0          Passed - eGFR in normal range and within 360 days    GFR, Est African American  Date Value Ref Range Status  03/18/2015 >89 >=60 mL/min Final   GFR calc Af Amer  Date Value Ref Range Status  04/16/2018 121 >59 mL/min/1.73 Final   GFR, Est Non African American  Date Value Ref Range Status  03/18/2015 >89 >=60 mL/min Final    Comment:      The estimated GFR is a calculation valid for adults (>=65 years old) that uses the CKD-EPI algorithm to adjust for age and sex. It is   not to be used for children, pregnant women, hospitalized patients,    patients on dialysis, or with rapidly changing kidney function. According to the NKDEP, eGFR >89 is normal,  60-89 shows mild impairment, 30-59 shows moderate impairment, 15-29 shows severe impairment and <15 is ESRD.      GFR calc non Af Amer  Date Value Ref Range Status  04/16/2018 105 >59 mL/min/1.73 Final   GFR  Date Value Ref Range Status  12/07/2010 136.79 >60.00 mL/min Final         Passed - Valid encounter within last 6 months    Recent Outpatient Visits          2 months ago Essential hypertension   [facility], Lilia Argue, MD   12 months ago Acute non-recurrent maxillary sinusitis   [facility], Lilia Argue, MD   1 year ago Essential hypertension   [facility], Ranell Patrick, MD   1 year ago Hyperglycemia   [facility], Ranell Patrick, MD   2 years ago Cough   [facility], MD  Future Appointments            In 1 month [place], Lilia Argue, MD Primary Care at [place], [facility]

## 2018-07-22 ENCOUNTER — Other Ambulatory Visit: Payer: Self-pay | Admitting: Family Medicine

## 2018-07-22 DIAGNOSIS — I10 Essential (primary) hypertension: Secondary | ICD-10-CM

## 2018-08-17 ENCOUNTER — Ambulatory Visit: Payer: Self-pay | Admitting: Family Medicine

## 2018-08-21 ENCOUNTER — Other Ambulatory Visit: Payer: Self-pay | Admitting: Family Medicine

## 2018-08-21 NOTE — Telephone Encounter (Signed)
Requested Prescriptions  Pending Prescriptions Disp Refills  . FARXIGA 10 MG TABS tablet [Pharmacy Med Name: FARXIGA 10 MG TABLET] 60 tablet 0    Sig: TAKE 1 TABLET BY MOUTH EVERY DAY     Endocrinology:  Diabetes - SGLT2 Inhibitors Passed - 08/21/2018  2:07 AM      Passed - Cr in normal range and within 360 days    Creat  Date Value Ref Range Status  03/23/2015 0.64 0.50 - 1.10 mg/dL Final   Creatinine, Ser  Date Value Ref Range Status  04/16/2018 0.67 0.57 - 1.00 mg/dL Final         Passed - LDL in normal range and within 360 days    LDL Calculated  Date Value Ref Range Status  04/16/2018 95 0 - 99 mg/dL Final         Passed - HBA1C is between 0 and 7.9 and within 180 days    Hemoglobin A1C  Date Value Ref Range Status  04/16/2018 7.6 (A) 4.0 - 5.6 % Final   Hgb A1c MFr Bld  Date Value Ref Range Status  06/27/2017 7.2 (H) 4.8 - 5.6 % Final    Comment:             Prediabetes: 5.7 - 6.4          Diabetes: >6.4          Glycemic control for adults with diabetes: <7.0          Passed - eGFR in normal range and within 360 days    GFR, Est African American  Date Value Ref Range Status  03/18/2015 >89 >=60 mL/min Final   GFR calc Af Amer  Date Value Ref Range Status  04/16/2018 121 >59 mL/min/1.73 Final   GFR, Est Non African American  Date Value Ref Range Status  03/18/2015 >89 >=60 mL/min Final    Comment:      The estimated GFR is a calculation valid for adults (>=79 years old) that uses the CKD-EPI algorithm to adjust for age and sex. It is   not to be used for children, pregnant women, hospitalized patients,    patients on dialysis, or with rapidly changing kidney function. According to the NKDEP, eGFR >89 is normal, 60-89 shows mild impairment, 30-59 shows moderate impairment, 15-29 shows severe impairment and <15 is ESRD.      GFR calc non Af Amer  Date Value Ref Range Status  04/16/2018 105 >59 mL/min/1.73 Final   GFR  Date Value Ref Range Status   12/07/2010 136.79 >60.00 mL/min Final         Passed - Valid encounter within last 6 months    Recent Outpatient Visits          4 months ago Essential hypertension   [facility], Lilia Argue, MD   1 year ago Acute non-recurrent maxillary sinusitis   [facility], Lilia Argue, MD   1 year ago Essential hypertension   [facility], Ranell Patrick, MD   1 year ago Hyperglycemia   [facility], Ranell Patrick, MD   2 years ago Cough   [facility], [facility]

## 2018-08-26 ENCOUNTER — Other Ambulatory Visit: Payer: Self-pay | Admitting: Family Medicine

## 2018-08-30 ENCOUNTER — Telehealth (INDEPENDENT_AMBULATORY_CARE_PROVIDER_SITE_OTHER): Payer: Commercial Managed Care - PPO | Admitting: Family Medicine

## 2018-08-30 ENCOUNTER — Other Ambulatory Visit: Payer: Self-pay

## 2018-08-30 DIAGNOSIS — I10 Essential (primary) hypertension: Secondary | ICD-10-CM

## 2018-08-30 DIAGNOSIS — M1711 Unilateral primary osteoarthritis, right knee: Secondary | ICD-10-CM | POA: Diagnosis not present

## 2018-08-30 DIAGNOSIS — E119 Type 2 diabetes mellitus without complications: Secondary | ICD-10-CM | POA: Diagnosis not present

## 2018-08-30 DIAGNOSIS — B3731 Acute candidiasis of vulva and vagina: Secondary | ICD-10-CM

## 2018-08-30 DIAGNOSIS — B373 Candidiasis of vulva and vagina: Secondary | ICD-10-CM

## 2018-08-30 MED ORDER — FLUCONAZOLE 150 MG PO TABS: 150.0000 mg | ORAL_TABLET | Freq: Once | ORAL | 0 refills | Status: AC

## 2018-08-30 MED ORDER — DICLOFENAC SODIUM 1 % TD GEL: 2.0000 g | Freq: Four times a day (QID) | TRANSDERMAL | 1 refills | Status: DC

## 2018-08-30 NOTE — Progress Notes (Signed)
Says her bp is doing very well. Has been having trouble with her knee pain and is asking for voltaren gel. Was given that by Nyoka Cowden. She says she needs no refills needed at this time. She is having some anxiety only due to the current situation. Other than that, all is well. She has not done any air travel. She has pended labs, She is making an appt to have nurse visit

## 2018-08-30 NOTE — Progress Notes (Signed)
Virtual Visit via telephone Note  I connected with a patient on 08/30/18 at 324pm by telephone and verified that I am speaking with the correct person using two identifiers. [name] is currently located at home and patient is currently with her during visit. The provider, Rutherford Guys, MD is located in their office at time of visit.  I discussed the limitations, risks, security and privacy concerns of performing an evaluation and management service by telephone and the availability of in person appointments. I also discussed with the patient that there may be a patient responsible charge related to this service. The patient expressed understanding and agreed to proceed.  CC: followup on her blood pressure  Telephone visit today for routine followup: DM2, HTN, knee OA  HPI  Last OV nov 2019 added farxiga  Had a yeast infection, saw gyn, given fluconazole, still having very mild sx Has been checking BP at home 131/68, 126/74 Not checking sugars, has been cutting on simple sugars Weighing 202 lbs voltaren gel working well for her knees, needs refills  Wt Readings from Last 3 Encounters:  04/16/18 211 lb (95.7 kg)  09/17/17 208 lb (94.3 kg)  07/14/17 206 lb 12.8 oz (93.8 kg)   ? Lab Results  Component Value Date   HGBA1C 7.6 (A) 04/16/2018   HGBA1C 7.2 (H) 06/27/2017   HGBA1C CANCELED 12/27/2016   Lab Results  Component Value Date   [place] 95 04/16/2018   CREATININE 0.67 04/16/2018     Fall Risk  08/30/2018 08/30/2018 04/16/2018 07/14/2017 06/27/2017  Falls in the past year? 0 0 0 No No  Number falls in past yr: 0 0 - - -  Injury with Fall? 0 0 - - -  Some encounter information is confidential and restricted. Go to Review Flowsheets activity to see all data.     Depression screen [facility] 2/9 08/30/2018 08/30/2018 07/14/2017  Decreased Interest 0 0 0  Down, Depressed, Hopeless 0 0 0  PHQ - 2 Score 0 0 0  Some encounter information is confidential and restricted. Go to  Review Flowsheets activity to see all data.    Allergies  Allergen Reactions  . Ace Inhibitors Cough    She did have borderline swelling to the tip of her uvula.    Prior to Admission medications   Medication Sig Start Date End Date Taking? Authorizing Provider  amLODipine (NORVASC) 2.5 MG tablet Take 1 tablet (2.5 mg total) by mouth daily. 04/16/18   Rutherford Guys, MD  FARXIGA 10 MG TABS tablet TAKE 1 TABLET BY MOUTH EVERY DAY 08/21/18   Rutherford Guys, MD  hydrochlorothiazide (HYDRODIURIL) 12.5 MG tablet TAKE 1 TABLET BY MOUTH EVERY DAY 07/23/18   Rutherford Guys, MD  ibuprofen (ADVIL,MOTRIN) 800 MG tablet TAKE 1 TABLET BY MOUTH EVERY 8 HOURS AS NEEDED FOR PAIN 05/08/18   Rutherford Guys, MD  methocarbamol (ROBAXIN) 500 MG tablet Take 1 tablet (500 mg total) by mouth 2 (two) times daily. 09/17/17   Lacretia Leigh, MD    Past Medical History:  Diagnosis Date  . Anxiety   . Asthma    IN PAST NO RECENT PROBLEMS  . Blood clot in vein 2008   right leg  . Depression   . Epigastric pain   . Gallstones   . Headache(784.0)    Migraines  . Heartburn   . Hypertension   . Iron deficiency anemia     Past Surgical History:  Procedure Laterality Date  .  CHOLECYSTECTOMY  10/05/2011   Procedure: LAPAROSCOPIC CHOLECYSTECTOMY WITH INTRAOPERATIVE CHOLANGIOGRAM;  Surgeon: Pedro Earls, MD;  Location: [facility];  Service: General;  Laterality: N/A;  . COLONOSCOPY    . ROBOTIC ASSISTED TOTAL HYSTERECTOMY WITH SALPINGECTOMY Bilateral 10/12/2015   Procedure: ROBOTIC ASSISTED TOTAL HYSTERECTOMY WITH SALPINGECTOMY;  Surgeon: Brien Few, MD;  Location: [facility];  Service: Gynecology;  Laterality: Bilateral;  . TUBAL LIGATION    . TUBAL REVERSAL      Social History   Tobacco Use  . Smoking status: Never Smoker  . Smokeless tobacco: Never Used  Substance Use Topics  . Alcohol use: Yes    Comment: RARE    Family History  Problem Relation Age of Onset  . Hypertension Mother   .  Diabetes Mother   . Cancer Father   . Diabetes Unknown        Family History  . Colon cancer Neg Hx     ROS  Per hpi  Objective  Vitals as reported by the patient: per above  There were no vitals filed for this visit.  ASSESSMENT and PLAN  1. Osteoarthritis of right knee, unspecified osteoarthritis type Controlled. Continue current regime.   2. Essential hypertension Controlled. Continue current regime.   3. Diabetes mellitus type II, non insulin dependent (HCC) Checking a1c, might need to change farxiga, given weight loss efforts consider GLP1  4. Vaginal yeast infection Discussed might be related to [place]. Diflucan rx sent. If not able to clear, will need to dc farxiga  Other orders - diclofenac sodium (VOLTAREN) 1 % GEL; Apply 2 g topically 4 (four) times daily. - fluconazole (DIFLUCAN) 150 MG tablet; Take 1 tablet (150 mg total) by mouth once for 1 dose. Repeat if needed  FOLLOW-UP: next week for labs. 4 weeks for followup on yeast infections   The above assessment and management plan was discussed with the patient. The patient verbalized understanding of and has agreed to the management plan. Patient is aware to call the clinic if symptoms persist or worsen. Patient is aware when to return to the clinic for a follow-up visit. Patient educated on when it is appropriate to go to the emergency department.    I provided 14 minutes of non-face-to-face time during this encounter.  Rutherford Guys, MD [facility], [place] [address] Ph.  [phone] Fax [phone]

## 2018-09-01 ENCOUNTER — Encounter (HOSPITAL_COMMUNITY): Payer: Self-pay

## 2018-09-01 ENCOUNTER — Other Ambulatory Visit: Payer: Self-pay

## 2018-09-01 ENCOUNTER — Emergency Department (HOSPITAL_COMMUNITY): Payer: Commercial Managed Care - PPO

## 2018-09-01 ENCOUNTER — Emergency Department (HOSPITAL_COMMUNITY)
Admission: EM | Admit: 2018-09-01 | Discharge: 2018-09-02 | Disposition: A | Discharge status: Home / Self Care | Payer: Commercial Managed Care - PPO | Attending: Emergency Medicine | Admitting: Emergency Medicine

## 2018-09-01 DIAGNOSIS — R51 Headache: Secondary | ICD-10-CM | POA: Insufficient documentation

## 2018-09-01 DIAGNOSIS — R1084 Generalized abdominal pain: Secondary | ICD-10-CM | POA: Diagnosis not present

## 2018-09-01 DIAGNOSIS — M542 Cervicalgia: Secondary | ICD-10-CM | POA: Diagnosis not present

## 2018-09-01 DIAGNOSIS — M25552 Pain in left hip: Secondary | ICD-10-CM | POA: Diagnosis not present

## 2018-09-01 DIAGNOSIS — E876 Hypokalemia: Secondary | ICD-10-CM | POA: Diagnosis not present

## 2018-09-01 DIAGNOSIS — M25562 Pain in left knee: Secondary | ICD-10-CM | POA: Diagnosis not present

## 2018-09-01 DIAGNOSIS — I10 Essential (primary) hypertension: Secondary | ICD-10-CM | POA: Insufficient documentation

## 2018-09-01 DIAGNOSIS — Z79899 Other long term (current) drug therapy: Secondary | ICD-10-CM | POA: Diagnosis not present

## 2018-09-01 DIAGNOSIS — J45909 Unspecified asthma, uncomplicated: Secondary | ICD-10-CM | POA: Diagnosis not present

## 2018-09-01 DIAGNOSIS — M25512 Pain in left shoulder: Secondary | ICD-10-CM | POA: Diagnosis not present

## 2018-09-01 DIAGNOSIS — M25561 Pain in right knee: Secondary | ICD-10-CM

## 2018-09-01 DIAGNOSIS — R079 Chest pain, unspecified: Secondary | ICD-10-CM | POA: Insufficient documentation

## 2018-09-01 LAB — COMPREHENSIVE METABOLIC PANEL
ALK PHOS: 71 U/L (ref 38–126)
ALT: 28 U/L (ref 0–44)
AST: 28 U/L (ref 15–41)
Albumin: 4 g/dL (ref 3.5–5.0)
Anion gap: 11 (ref 5–15)
BUN: 13 mg/dL (ref 6–20)
CALCIUM: 9.2 mg/dL (ref 8.9–10.3)
CO2: 22 mmol/L (ref 22–32)
Chloride: 101 mmol/L (ref 98–111)
Creatinine, Ser: 0.83 mg/dL (ref 0.44–1.00)
GFR calc Af Amer: >60 mL/min (ref >60)
GFR calc non Af Amer: >60 mL/min (ref >60)
Glucose, Bld: 122 mg/dL — ABNORMAL HIGH (ref 70–99)
Potassium: 3.2 mmol/L — ABNORMAL LOW (ref 3.5–5.1)
Sodium: 134 mmol/L — ABNORMAL LOW (ref 135–145)
Total Bilirubin: 0.6 mg/dL (ref 0.3–1.2)
Total Protein: 7.8 g/dL (ref 6.5–8.1)

## 2018-09-01 LAB — CBC WITH DIFFERENTIAL/PLATELET
Abs Immature Granulocytes: 0.01 10*3/uL (ref 0.00–0.07)
Basophils Absolute: 0 10*3/uL (ref 0.0–0.1)
Basophils Relative: 0 %
EOS PCT: 1 %
Eosinophils Absolute: 0.1 10*3/uL (ref 0.0–0.5)
HCT: 39.3 % (ref 36.0–46.0)
HEMOGLOBIN: 12.3 g/dL (ref 12.0–15.0)
Immature Granulocytes: 0 %
Lymphocytes Relative: 41 %
Lymphs Abs: 2.6 10*3/uL (ref 0.7–4.0)
MCH: 24.8 pg — ABNORMAL LOW (ref 26.0–34.0)
MCHC: 31.3 g/dL (ref 30.0–36.0)
MCV: 79.4 fL — ABNORMAL LOW (ref 80.0–100.0)
Monocytes Absolute: 0.4 10*3/uL (ref 0.1–1.0)
Monocytes Relative: 7 %
Neutro Abs: 3.2 10*3/uL (ref 1.7–7.7)
Neutrophils Relative %: 51 %
Platelets: 281 10*3/uL (ref 150–400)
RBC: 4.95 MIL/uL (ref 3.87–5.11)
RDW: 15.9 % — ABNORMAL HIGH (ref 11.5–15.5)
WBC: 6.3 10*3/uL (ref 4.0–10.5)
nRBC: 0 % (ref 0.0–0.2)

## 2018-09-01 LAB — I-STAT BETA HCG BLOOD, ED (MC, WL, AP ONLY): I-stat hCG, quantitative: <5 m[IU]/mL (ref <5)

## 2018-09-01 LAB — I-STAT CREATININE, ED: Creatinine, Ser: 0.8 mg/dL (ref 0.44–1.00)

## 2018-09-01 LAB — LACTIC ACID, PLASMA: Lactic Acid, Venous: 1.4 mmol/L (ref 0.5–1.9)

## 2018-09-01 LAB — PROTIME-INR
INR: 0.9 (ref 0.8–1.2)
Prothrombin Time: 12.5 seconds (ref 11.4–15.2)

## 2018-09-01 MED ORDER — HYDROCODONE-ACETAMINOPHEN 5-325 MG PO TABS
1.0000 | ORAL_TABLET | Freq: Once | ORAL | Status: AC
  Administered 2018-09-02: 1 via ORAL
  Filled 2018-09-01: qty 1

## 2018-09-01 MED ORDER — METHOCARBAMOL 500 MG PO TABS: 500.0000 mg | ORAL_TABLET | Freq: Two times a day (BID) | ORAL | 0 refills | Status: DC

## 2018-09-01 MED ORDER — FENTANYL CITRATE (PF) 100 MCG/2ML IJ SOLN
50.0000 ug | Freq: Once | INTRAMUSCULAR | Status: AC
  Administered 2018-09-01: 50 ug via INTRAVENOUS
  Filled 2018-09-01: qty 2

## 2018-09-01 MED ORDER — METHOCARBAMOL 500 MG PO TABS
500.0000 mg | ORAL_TABLET | Freq: Once | ORAL | Status: AC
  Administered 2018-09-02: 500 mg via ORAL
  Filled 2018-09-01: qty 1

## 2018-09-01 MED ORDER — NAPROXEN 500 MG PO TABS: 500.0000 mg | ORAL_TABLET | Freq: Two times a day (BID) | ORAL | 0 refills | Status: DC

## 2018-09-01 MED ORDER — IOHEXOL 300 MG/ML  SOLN
100.0000 mL | Freq: Once | INTRAMUSCULAR | Status: AC | PRN
  Administered 2018-09-01: 100 mL via INTRAVENOUS

## 2018-09-01 NOTE — ED Notes (Addendum)
Patient transported to x-ray. ?
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
## 2018-09-01 NOTE — ED Notes (Signed)
Patient transported to CT 

## 2018-09-01 NOTE — ED Provider Notes (Signed)
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    Isla Pence, MD 09/01/18 2359
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
## 2018-09-06 ENCOUNTER — Other Ambulatory Visit: Payer: Self-pay

## 2018-09-06 ENCOUNTER — Telehealth (INDEPENDENT_AMBULATORY_CARE_PROVIDER_SITE_OTHER): Payer: Commercial Managed Care - PPO | Admitting: Family Medicine

## 2018-09-06 DIAGNOSIS — E876 Hypokalemia: Secondary | ICD-10-CM | POA: Diagnosis not present

## 2018-09-06 DIAGNOSIS — M25552 Pain in left hip: Secondary | ICD-10-CM | POA: Diagnosis not present

## 2018-09-06 DIAGNOSIS — M25512 Pain in left shoulder: Secondary | ICD-10-CM | POA: Diagnosis not present

## 2018-09-06 MED ORDER — DICLOFENAC SODIUM 1 % TD GEL: 2.0000 g | Freq: Four times a day (QID) | TRANSDERMAL | 1 refills | Status: DC
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
## 2018-09-15 ENCOUNTER — Other Ambulatory Visit: Payer: Self-pay | Admitting: Family Medicine

## 2018-10-29 ENCOUNTER — Other Ambulatory Visit: Payer: Self-pay | Admitting: Family Medicine

## 2018-10-29 DIAGNOSIS — I10 Essential (primary) hypertension: Secondary | ICD-10-CM

## 2018-12-05 ENCOUNTER — Other Ambulatory Visit: Payer: Self-pay | Admitting: Family Medicine

## 2018-12-05 DIAGNOSIS — I10 Essential (primary) hypertension: Secondary | ICD-10-CM

## 2019-01-24 ENCOUNTER — Other Ambulatory Visit: Payer: Self-pay | Admitting: Family Medicine

## 2019-03-25 ENCOUNTER — Other Ambulatory Visit: Payer: Self-pay | Admitting: Family Medicine

## 2019-03-25 ENCOUNTER — Telehealth: Payer: Self-pay | Admitting: *Deleted

## 2019-03-25 ENCOUNTER — Other Ambulatory Visit: Payer: Self-pay

## 2019-03-25 ENCOUNTER — Telehealth: Payer: Self-pay | Admitting: Family Medicine

## 2019-03-25 DIAGNOSIS — I10 Essential (primary) hypertension: Secondary | ICD-10-CM

## 2019-03-25 MED ORDER — AMLODIPINE BESYLATE 2.5 MG PO TABS: 2.5000 mg | ORAL_TABLET | Freq: Every day | ORAL | 0 refills | Status: DC

## 2019-03-25 NOTE — Telephone Encounter (Signed)
Requested medication (s) are due for refill today: yes  Requested medication (s) are on the active medication list: yes  Last refill:  12/29/2018  Future visit scheduled: no   Notes to clinic:  Overdue for office visit   Requested Prescriptions  Pending Prescriptions Disp Refills   hydrochlorothiazide (HYDRODIURIL) 12.5 MG tablet [Pharmacy Med Name: HYDROCHLOROTHIAZIDE 12.5 MG TB] 90 tablet 0    Sig: TAKE 1 TABLET BY MOUTH DAILY. SCHEDULE AN OFFICE VISIT     Cardiovascular: Diuretics - Thiazide Failed - 03/25/2019  1:31 AM      Failed - K in normal range and within 360 days    Potassium  Date Value Ref Range Status  09/01/2018 3.2 (L) 3.5 - 5.1 mmol/L Final         Failed - Na in normal range and within 360 days    Sodium  Date Value Ref Range Status  09/01/2018 134 (L) 135 - 145 mmol/L Final  04/16/2018 142 134 - 144 mmol/L Final         Failed - Valid encounter within last 6 months    Recent Outpatient Visits          6 months ago Hypokalemia   [facility], Lilia Argue, MD   6 months ago Osteoarthritis of right knee, unspecified osteoarthritis type   [facility], Lilia Argue, MD   11 months ago Essential hypertension   [facility], Lilia Argue, MD   1 year ago Acute non-recurrent maxillary sinusitis   [facility], Lilia Argue, MD   1 year ago Essential hypertension   [facility], Ranell Patrick, MD             Passed - Ca in normal range and within 360 days    Calcium  Date Value Ref Range Status  09/01/2018 9.2 8.9 - 10.3 mg/dL Final         Passed - Cr in normal range and within 360 days    Creat  Date Value Ref Range Status  03/23/2015 0.64 0.50 - 1.10 mg/dL Final   Creatinine, Ser  Date Value Ref Range Status  09/01/2018 0.80 0.44 - 1.00 mg/dL Final         Passed - Last BP in normal range    BP Readings from Last 1 Encounters:  09/02/18 139/88

## 2019-03-25 NOTE — Telephone Encounter (Signed)
Please schedule an appointment Med /f/u for BP  30 days was sent in will need to be seen for any additional refills.  Thank You

## 2019-03-25 NOTE — Telephone Encounter (Signed)
Requesting refill for amLODipine

## 2019-03-25 NOTE — Telephone Encounter (Signed)
Spoke with pt and scheduled appt °

## 2019-03-28 ENCOUNTER — Other Ambulatory Visit: Payer: Self-pay

## 2019-03-28 ENCOUNTER — Ambulatory Visit (INDEPENDENT_AMBULATORY_CARE_PROVIDER_SITE_OTHER): Payer: Commercial Managed Care - PPO | Admitting: Family Medicine

## 2019-03-28 ENCOUNTER — Encounter: Payer: Self-pay | Admitting: Family Medicine

## 2019-03-28 VITALS — BP 123/71 | HR 100 | Temp 98.3°F | Ht 61.0 in | Wt 206.6 lb

## 2019-03-28 DIAGNOSIS — I10 Essential (primary) hypertension: Secondary | ICD-10-CM | POA: Diagnosis not present

## 2019-03-28 DIAGNOSIS — M1711 Unilateral primary osteoarthritis, right knee: Secondary | ICD-10-CM | POA: Diagnosis not present

## 2019-03-28 DIAGNOSIS — E119 Type 2 diabetes mellitus without complications: Secondary | ICD-10-CM

## 2019-03-28 DIAGNOSIS — Z8639 Personal history of other endocrine, nutritional and metabolic disease: Secondary | ICD-10-CM

## 2019-03-28 DIAGNOSIS — G4700 Insomnia, unspecified: Secondary | ICD-10-CM

## 2019-03-28 MED ORDER — TRIAMTERENE-HCTZ 37.5-25 MG PO TABS: 1.0000 | ORAL_TABLET | Freq: Every day | ORAL | 1 refills | Status: DC

## 2019-03-28 NOTE — Patient Instructions (Addendum)
   If you have lab work done today you will be contacted with your lab results within the next 2 weeks.  If you have not heard from us then please contact us. The fastest way to get your results is to register for My Chart.   IF you received an x-ray today, you will receive an invoice from [facility]. Please contact [facility] at [phone] with questions or concerns regarding your invoice.   IF you received labwork today, you will receive an invoice from [facility]. Please contact [facility] at [phone] with questions or concerns regarding your invoice.   Our billing staff will not be able to assist you with questions regarding bills from these companies.  You will be contacted with the lab results as soon as they are available. The fastest way to get your results is to activate your My Chart account. Instructions are located on the last page of this paperwork. If you have not heard from us regarding the results in 2 weeks, please contact this office.     Insomnia Insomnia is a sleep disorder that makes it difficult to fall asleep or stay asleep. Insomnia can cause fatigue, low energy, difficulty concentrating, mood swings, and poor performance at work or school. There are three different ways to classify insomnia:  Difficulty falling asleep.  Difficulty staying asleep.  Waking up too early in the morning. Any type of insomnia can be long-term (chronic) or short-term (acute). Both are common. Short-term insomnia usually lasts for three months or less. Chronic insomnia occurs at least three times a week for longer than three months. What are the causes? Insomnia may be caused by another condition, situation, or substance, such as:  Anxiety.  Certain medicines.  Gastroesophageal reflux disease (GERD) or other gastrointestinal conditions.  Asthma or other breathing conditions.  Restless legs syndrome, sleep apnea, or other sleep disorders.  Chronic  pain.  Menopause.  Stroke.  Abuse of alcohol, tobacco, or illegal drugs.  Mental health conditions, such as depression.  Caffeine.  Neurological disorders, such as Alzheimer's disease.  An overactive thyroid (hyperthyroidism). Sometimes, the cause of insomnia may not be known. What increases the risk? Risk factors for insomnia include:  Gender. Women are affected more often than men.  Age. Insomnia is more common as you get older.  Stress.  Lack of exercise.  Irregular work schedule or working night shifts.  Traveling between different time zones.  Certain medical and mental health conditions. What are the signs or symptoms? If you have insomnia, the main symptom is having trouble falling asleep or having trouble staying asleep. This may lead to other symptoms, such as:  Feeling fatigued or having low energy.  Feeling nervous about going to sleep.  Not feeling rested in the morning.  Having trouble concentrating.  Feeling irritable, anxious, or depressed. How is this diagnosed? This condition may be diagnosed based on:  Your symptoms and medical history. Your health care provider may ask about: ? Your sleep habits. ? Any medical conditions you have. ? Your mental health.  A physical exam. How is this treated? Treatment for insomnia depends on the cause. Treatment may focus on treating an underlying condition that is causing insomnia. Treatment may also include:  Medicines to help you sleep.  Counseling or therapy.  Lifestyle adjustments to help you sleep better. Follow these instructions at home: Eating and drinking   Limit or avoid alcohol, caffeinated beverages, and cigarettes, especially close to bedtime. These can disrupt your sleep.    Do not eat a large meal or eat spicy foods right before bedtime. This can lead to digestive discomfort that can make it hard for you to sleep. Sleep habits   Keep a sleep diary to help you and your health care  provider figure out what could be causing your insomnia. Write down: ? When you sleep. ? When you wake up during the night. ? How well you sleep. ? How rested you feel the next day. ? Any side effects of medicines you are taking. ? What you eat and drink.  Make your bedroom a dark, comfortable place where it is easy to fall asleep. ? Put up shades or blackout curtains to block light from outside. ? Use a white noise machine to block noise. ? Keep the temperature cool.  Limit screen use before bedtime. This includes: ? Watching TV. ? Using your smartphone, tablet, or computer.  Stick to a routine that includes going to bed and waking up at the same times every day and night. This can help you fall asleep faster. Consider making a quiet activity, such as reading, part of your nighttime routine.  Try to avoid taking naps during the day so that you sleep better at night.  Get out of bed if you are still awake after 15 minutes of trying to sleep. Keep the lights down, but try reading or doing a quiet activity. When you feel sleepy, go back to bed. General instructions  Take over-the-counter and prescription medicines only as told by your health care provider.  Exercise regularly, as told by your health care provider. Avoid exercise starting several hours before bedtime.  Use relaxation techniques to manage stress. Ask your health care provider to suggest some techniques that may work well for you. These may include: ? Breathing exercises. ? Routines to release muscle tension. ? Visualizing peaceful scenes.  Make sure that you drive carefully. Avoid driving if you feel very sleepy.  Keep all follow-up visits as told by your health care provider. This is important. Contact a health care provider if:  You are tired throughout the day.  You have trouble in your daily routine due to sleepiness.  You continue to have sleep problems, or your sleep problems get worse. Get help right  away if:  You have serious thoughts about hurting yourself or someone else. If you ever feel like you may hurt yourself or others, or have thoughts about taking your own life, get help right away. You can go to your nearest emergency department or call:  Your local emergency services (911 in the [place]  A suicide crisis helpline, such as the National Suicide Prevention Lifeline at 1-800-273-8255. This is open 24 hours a day. Summary  Insomnia is a sleep disorder that makes it difficult to fall asleep or stay asleep.  Insomnia can be long-term (chronic) or short-term (acute).  Treatment for insomnia depends on the cause. Treatment may focus on treating an underlying condition that is causing insomnia.  Keep a sleep diary to help you and your health care provider figure out what could be causing your insomnia. This information is not intended to replace advice given to you by your health care provider. Make sure you discuss any questions you have with your health care provider. Document Released: 05/20/2000 Document Revised: 05/05/2017 Document Reviewed: 03/02/2017 Elsevier Patient Education  2020 Elsevier Inc.  

## 2019-03-28 NOTE — Progress Notes (Signed)
10/22/20203:14 PM  [name] 1970-12-12, 48 y.o., female [id number]  Chief Complaint  Patient presents with  . Hypertension    HPI:   Patient is a 48 y.o. female with past medical history significant for HTN, DM2, OA of knee who presents today for followup  Last in person OV Nov 2019 Patient is not taking faxiga Not checking cbgs  Takes ibu 800mg  one or 2 day when having pain, right knee Also uses diclofenac gel  Has trouble with sleep Sleeps with TV on Starting to have hot flashes Wondering about melatonin  Lab Results  Component Value Date   CREATININE 0.80 09/01/2018   BUN 13 09/01/2018   NA 134 (L) 09/01/2018   K 3.2 (L) 09/01/2018   CL 101 09/01/2018   CO2 22 09/01/2018   Lab Results  Component Value Date   HGBA1C 7.6 (A) 04/16/2018    Depression screen PHQ 2/9 09/06/2018 08/30/2018 08/30/2018  Decreased Interest 0 0 0  Down, Depressed, Hopeless 0 0 0  PHQ - 2 Score 0 0 0  Some encounter information is confidential and restricted. Go to Review Flowsheets activity to see all data.    Fall Risk  09/06/2018 08/30/2018 08/30/2018 04/16/2018 07/14/2017  Falls in the past year? 0 0 0 0 No  Number falls in past yr: - 0 0 - -  Injury with Fall? - 0 0 - -  Some encounter information is confidential and restricted. Go to Review Flowsheets activity to see all data.     Allergies  Allergen Reactions  . Ace Inhibitors Cough    She did have borderline swelling to the tip of her uvula.    Prior to Admission medications   Medication Sig Start Date End Date Taking? Authorizing Provider  amLODipine (NORVASC) 2.5 MG tablet Take 1 tablet (2.5 mg total) by mouth daily. SCHEDULE OFFICE VISIT 03/25/19  Yes Rutherford Guys, MD  FARXIGA 10 MG TABS tablet TAKE 1 TABLET BY MOUTH EVERY DAY 08/21/18  Yes Rutherford Guys, MD  hydrochlorothiazide (HYDRODIURIL) 12.5 MG tablet TAKE 1 TABLET BY MOUTH DAILY. SCHEDULE AN OFFICE VISIT 03/25/19  Yes Rutherford Guys, MD  ibuprofen  (ADVIL) 800 MG tablet TAKE 1 TABLET BY MOUTH EVERY 8 HOURS AS NEEDED FOR PAIN 01/24/19  Yes Rutherford Guys, MD  methocarbamol (ROBAXIN) 500 MG tablet Take 1 tablet (500 mg total) by mouth 2 (two) times daily. 09/01/18  Yes Henderly, Britni A, PA-C  naproxen (NAPROSYN) 500 MG tablet Take 1 tablet (500 mg total) by mouth 2 (two) times daily. 09/01/18  Yes Henderly, Britni A, PA-C    Past Medical History:  Diagnosis Date  . Anxiety   . Asthma    IN PAST NO RECENT PROBLEMS  . Blood clot in vein 2008   right leg  . Depression   . Epigastric pain   . Gallstones   . Headache(784.0)    Migraines  . Heartburn   . Hypertension   . Iron deficiency anemia     Past Surgical History:  Procedure Laterality Date  . CHOLECYSTECTOMY  10/05/2011   Procedure: LAPAROSCOPIC CHOLECYSTECTOMY WITH INTRAOPERATIVE CHOLANGIOGRAM;  Surgeon: Pedro Earls, MD;  Location: [facility];  Service: General;  Laterality: N/A;  . COLONOSCOPY    . ROBOTIC ASSISTED TOTAL HYSTERECTOMY WITH SALPINGECTOMY Bilateral 10/12/2015   Procedure: ROBOTIC ASSISTED TOTAL HYSTERECTOMY WITH SALPINGECTOMY;  Surgeon: Brien Few, MD;  Location: [facility];  Service: Gynecology;  Laterality: Bilateral;  . TUBAL LIGATION    .  TUBAL REVERSAL      Social History   Tobacco Use  . Smoking status: Never Smoker  . Smokeless tobacco: Never Used  Substance Use Topics  . Alcohol use: Yes    Comment: RARE    Family History  Problem Relation Age of Onset  . Hypertension Mother   . Diabetes Mother   . Cancer Father   . Diabetes Other        Family History  . Colon cancer Neg Hx     Review of Systems  Constitutional: Negative for chills and fever.  Respiratory: Negative for cough and shortness of breath.   Cardiovascular: Negative for chest pain, palpitations and leg swelling.  Gastrointestinal: Negative for abdominal pain, nausea and vomiting.  per hpi   OBJECTIVE:  Today's Vitals   03/28/19 1507  BP: 123/71  Pulse: 100  Temp:  98.3 F (36.8 C)  SpO2: 97%  Weight: 206 lb 9.6 oz (93.7 kg)  Height: 5\' 1"  (1.549 m)   Body mass index is 39.04 kg/m.   Physical Exam Vitals signs and nursing note reviewed.  Constitutional:      Appearance: She is well-developed.  HENT:     Head: Normocephalic and atraumatic.     Mouth/Throat:     Pharynx: No oropharyngeal exudate.  Eyes:     General: No scleral icterus.    Conjunctiva/sclera: Conjunctivae normal.     Pupils: Pupils are equal, round, and reactive to light.  Neck:     Musculoskeletal: Neck supple.  Cardiovascular:     Rate and Rhythm: Normal rate and regular rhythm.     Heart sounds: Normal heart sounds. No murmur. No friction rub. No gallop.   Pulmonary:     Effort: Pulmonary effort is normal.     Breath sounds: Normal breath sounds. No wheezing or rales.  Skin:    General: Skin is warm and dry.  Neurological:     Mental Status: She is alert and oriented to person, place, and time.     No results found for this or any previous visit (from the past 24 hour(s)).  No results found.   ASSESSMENT and PLAN  1. Essential hypertension Controlled. Discussed options. Changing to maxzide given h/o hypokelamia.  2. H/O hypokalemia - Comprehensive metabolic panel  3. Diabetes mellitus type II, non insulin dependent ([place]) Patient resistant to diagnosis.  - Lipid panel - TSH - Hemoglobin A1c - Microalbumin/Creatinine Ratio, Urine  4. Osteoarthritis of right knee, unspecified osteoarthritis type Stable. Discussed NSAIDs r/se/b, use minimally, favor topical  5. Insomnia, unspecified type Discussed sleep hygiene, melatonin, OTC sleep aides.   Other orders - triamterene-hydrochlorothiazide (MAXZIDE-25) 37.5-25 MG tablet; Take 1 tablet by mouth daily.  Return in about 3 months (around 06/28/2019).    Rutherford Guys, MD [facility], [place] [address] Ph.  [phone] Fax [phone]

## 2019-03-29 LAB — COMPREHENSIVE METABOLIC PANEL
ALT: 26 IU/L (ref 0–32)
AST: 27 IU/L (ref 0–40)
Albumin/Globulin Ratio: 1.3 (ref 1.2–2.2)
Albumin: 4.3 g/dL (ref 3.8–4.8)
Alkaline Phosphatase: 76 IU/L (ref 39–117)
BUN/Creatinine Ratio: 23 (ref 9–23)
BUN: 18 mg/dL (ref 6–24)
Bilirubin Total: 0.3 mg/dL (ref 0.0–1.2)
CO2: 20 mmol/L (ref 20–29)
Calcium: 9.7 mg/dL (ref 8.7–10.2)
Chloride: 102 mmol/L (ref 96–106)
Creatinine, Ser: 0.79 mg/dL (ref 0.57–1.00)
GFR calc Af Amer: 102 mL/min/{1.73_m2} (ref >59)
GFR calc non Af Amer: 89 mL/min/{1.73_m2} (ref >59)
Globulin, Total: 3.3 g/dL (ref 1.5–4.5)
Glucose: 143 mg/dL — ABNORMAL HIGH (ref 65–99)
Potassium: 3.6 mmol/L (ref 3.5–5.2)
Sodium: 140 mmol/L (ref 134–144)
Total Protein: 7.6 g/dL (ref 6.0–8.5)

## 2019-03-29 LAB — LIPID PANEL
Chol/HDL Ratio: 5.1 ratio — ABNORMAL HIGH (ref 0.0–4.4)
Cholesterol, Total: 226 mg/dL — ABNORMAL HIGH (ref 100–199)
HDL: 44 mg/dL (ref >39)
LDL Chol Calc (NIH): 103 mg/dL — ABNORMAL HIGH (ref 0–99)
Triglycerides: 468 mg/dL — ABNORMAL HIGH (ref 0–149)
VLDL Cholesterol Cal: 79 mg/dL — ABNORMAL HIGH (ref 5–40)

## 2019-03-29 LAB — MICROALBUMIN / CREATININE URINE RATIO
Creatinine, Urine: 126.5 mg/dL
Microalb/Creat Ratio: 28 mg/g creat (ref 0–29)
Microalbumin, Urine: 35.6 ug/mL

## 2019-03-29 LAB — TSH: TSH: 1.4 u[IU]/mL (ref 0.450–4.500)

## 2019-03-29 LAB — HEMOGLOBIN A1C
Est. average glucose Bld gHb Est-mCnc: 166 mg/dL
Hgb A1c MFr Bld: 7.4 % — ABNORMAL HIGH (ref 4.8–5.6)

## 2019-04-01 ENCOUNTER — Other Ambulatory Visit: Payer: Self-pay | Admitting: Family Medicine

## 2019-04-01 MED ORDER — METFORMIN HCL 500 MG PO TABS: 500.0000 mg | ORAL_TABLET | Freq: Two times a day (BID) | ORAL | 1 refills | Status: DC

## 2019-04-09 ENCOUNTER — Other Ambulatory Visit: Payer: Self-pay | Admitting: Family Medicine

## 2019-04-09 MED ORDER — CETIRIZINE HCL 10 MG PO TABS: 10.0000 mg | ORAL_TABLET | Freq: Every day | ORAL | 11 refills | Status: AC

## 2019-05-25 ENCOUNTER — Other Ambulatory Visit: Payer: Self-pay | Admitting: Family Medicine

## 2019-05-25 DIAGNOSIS — I10 Essential (primary) hypertension: Secondary | ICD-10-CM

## 2019-05-26 ENCOUNTER — Encounter: Payer: Self-pay | Admitting: Family Medicine

## 2019-06-03 ENCOUNTER — Other Ambulatory Visit: Payer: Self-pay | Admitting: Emergency Medicine

## 2019-06-03 ENCOUNTER — Telehealth: Payer: Self-pay | Admitting: Family Medicine

## 2019-06-03 DIAGNOSIS — I10 Essential (primary) hypertension: Secondary | ICD-10-CM

## 2019-06-03 MED ORDER — TRIAMTERENE-HCTZ 37.5-25 MG PO TABS: 1.0000 | ORAL_TABLET | Freq: Every day | ORAL | 1 refills | Status: DC

## 2019-06-03 NOTE — Telephone Encounter (Signed)
triamterene-hydrochlorothiazide (MAXZIDE-25) 37.5-25 MG tablet [id number]    Pt has been out for 4 days  She has an appt scheduled and would like to see about getting a temporary med refill   Please advise

## 2019-06-11 ENCOUNTER — Other Ambulatory Visit: Payer: Self-pay | Admitting: Family Medicine

## 2019-06-11 DIAGNOSIS — I10 Essential (primary) hypertension: Secondary | ICD-10-CM

## 2019-06-24 ENCOUNTER — Ambulatory Visit: Payer: Commercial Managed Care - PPO | Admitting: Family Medicine

## 2019-07-01 ENCOUNTER — Ambulatory Visit: Payer: Commercial Managed Care - PPO | Admitting: Family Medicine

## 2019-07-05 ENCOUNTER — Encounter: Payer: Self-pay | Admitting: Family Medicine

## 2019-07-05 ENCOUNTER — Other Ambulatory Visit: Payer: Self-pay

## 2019-07-05 ENCOUNTER — Ambulatory Visit (INDEPENDENT_AMBULATORY_CARE_PROVIDER_SITE_OTHER): Payer: Commercial Managed Care - PPO | Admitting: Family Medicine

## 2019-07-05 VITALS — BP 134/80 | HR 87 | Temp 98.3°F | Ht 61.0 in | Wt 209.0 lb

## 2019-07-05 DIAGNOSIS — I10 Essential (primary) hypertension: Secondary | ICD-10-CM

## 2019-07-05 DIAGNOSIS — M1711 Unilateral primary osteoarthritis, right knee: Secondary | ICD-10-CM

## 2019-07-05 DIAGNOSIS — E119 Type 2 diabetes mellitus without complications: Secondary | ICD-10-CM

## 2019-07-05 MED ORDER — TRAMADOL HCL 50 MG PO TABS: 50.0000 mg | ORAL_TABLET | Freq: Three times a day (TID) | ORAL | 0 refills | Status: AC | PRN

## 2019-07-05 MED ORDER — TRIAMTERENE-HCTZ 37.5-25 MG PO TABS: 1.0000 | ORAL_TABLET | Freq: Every day | ORAL | 1 refills | Status: DC

## 2019-07-05 MED ORDER — IBUPROFEN 800 MG PO TABS: 800.0000 mg | ORAL_TABLET | Freq: Three times a day (TID) | ORAL | 1 refills | Status: AC | PRN

## 2019-07-05 MED ORDER — METFORMIN HCL 500 MG PO TABS: 500.0000 mg | ORAL_TABLET | Freq: Two times a day (BID) | ORAL | 1 refills | Status: DC

## 2019-07-05 NOTE — Progress Notes (Signed)
1/29/20215:20 PM  [name] February 09, 1971, 49 y.o., female [id number]  Chief Complaint  Patient presents with  . Diabetes    pt is having pain in the right knee thinks it may be arthritis, she is decling bllod work today. will come bk for blood work as lab visit    HPI:   Patient is a 49 y.o. 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 Go to Review Flowsheets activity to see all data.    Fall Risk  07/05/2019 09/06/2018 08/30/2018 08/30/2018 04/16/2018  Falls in the past year? 0 0 0 0 0  Number falls in past yr: 0 - 0 0 -  Injury with Fall? 0 - 0 0 -  Some encounter information is confidential and restricted. Go to Review Flowsheets activity to see all data.     Allergies  Allergen Reactions  . Ace Inhibitors Cough    She did have borderline swelling to the tip of her uvula.    Prior to Admission medications   Medication Sig Start Date End Date Taking? Authorizing Provider  cetirizine (ZYRTEC) 10 MG tablet Take 1 tablet (10 mg total) by mouth daily. 04/09/19  Yes Rutherford Guys, MD  ibuprofen  (ADVIL) 800 MG tablet TAKE 1 TABLET BY MOUTH EVERY 8 HOURS AS NEEDED FOR PAIN 01/24/19  Yes Rutherford Guys, MD  metFORMIN (GLUCOPHAGE) 500 MG tablet Take 1 tablet (500 mg total) by mouth 2 (two) times daily with a meal. 04/01/19  Yes Rutherford Guys, MD  triamterene-hydrochlorothiazide (MAXZIDE-25) 37.5-25 MG tablet Take 1 tablet by mouth daily. 06/03/19  Yes Rutherford Guys, MD    Past Medical History:  Diagnosis Date  . Anxiety   . Asthma    IN PAST NO RECENT PROBLEMS  . Blood clot in vein 2008   right leg  . Depression   . Epigastric pain   . Gallstones   . Headache(784.0)    Migraines  . Heartburn   . Hypertension   . Iron deficiency anemia     Past Surgical History:  Procedure Laterality Date  . CHOLECYSTECTOMY  10/05/2011   Procedure: LAPAROSCOPIC CHOLECYSTECTOMY WITH INTRAOPERATIVE CHOLANGIOGRAM;  Surgeon: Pedro Earls, MD;  Location: [facility];  Service: General;  Laterality: N/A;  . COLONOSCOPY    . ROBOTIC ASSISTED TOTAL HYSTERECTOMY WITH SALPINGECTOMY Bilateral 10/12/2015   Procedure: ROBOTIC ASSISTED TOTAL HYSTERECTOMY WITH SALPINGECTOMY;  Surgeon: Brien Few, MD;  Location: [facility];  Service: Gynecology;  Laterality: Bilateral;  . TUBAL LIGATION    . TUBAL REVERSAL  Social History   Tobacco Use  . Smoking status: Never Smoker  . Smokeless tobacco: Never Used  Substance Use Topics  . Alcohol use: Yes    Comment: RARE    Family History  Problem Relation Age of Onset  . Hypertension Mother   . Diabetes Mother   . Cancer Father   . Diabetes Other        Family History  . Colon cancer Neg Hx     Review of Systems  Constitutional: Negative for chills and fever.  Respiratory: Negative for cough and shortness of breath.   Cardiovascular: Negative for chest pain, palpitations and leg swelling.  Gastrointestinal: Negative for abdominal pain, nausea and vomiting.     OBJECTIVE:  Today's Vitals   07/05/19 1701  BP: 134/80  Pulse: 87  Temp:  98.3 F (36.8 C)  SpO2: 98%  Weight: 209 lb (94.8 kg)  Height: 5\' 1"  (1.549 m)   Body mass index is 39.49 kg/m.  Wt Readings from Last 3 Encounters:  07/05/19 209 lb (94.8 kg)  03/28/19 206 lb 9.6 oz (93.7 kg)  09/01/18 220 lb (99.8 kg)    Physical Exam Vitals and nursing note reviewed.  Constitutional:      Appearance: She is well-developed.  HENT:     Head: Normocephalic and atraumatic.  Eyes:     General: No scleral icterus.    Conjunctiva/sclera: Conjunctivae normal.     Pupils: Pupils are equal, round, and reactive to light.  Pulmonary:     Effort: Pulmonary effort is normal.  Musculoskeletal:     Cervical back: Neck supple.  Skin:    General: Skin is warm and dry.  Neurological:     Mental Status: She is alert and oriented to person, place, and time.     No results found for this or any previous visit (from the past 24 hour(s)).  No results found.   ASSESSMENT and PLAN  1. Diabetes mellitus type II, non insulin dependent (HCC) Fasting cbgs above goal. Discussed LFM and med options, considering GLP1 as will help with weight loss. Will like to revisit at next OV - Comprehensive metabolic panel; Future - Hemoglobin A1c; Future  2. Essential hypertension Controlled. Continue current regime.  - triamterene-hydrochlorothiazide (MAXZIDE-25) 37.5-25 MG tablet; Take 1 tablet by mouth daily.  3. Osteoarthritis of right knee, unspecified osteoarthritis type Declines referral to ortho. Trial of tramadol at bedtime prn. pmp reviewed. - traMADol (ULTRAM) 50 MG tablet; Take 1 tablet (50 mg total) by mouth every 8 (eight) hours as needed for up to 5 days.  Other orders - metFORMIN (GLUCOPHAGE) 500 MG tablet; Take 1 tablet (500 mg total) by mouth 2 (two) times daily with a meal. - ibuprofen (ADVIL) 800 MG tablet; Take 1 tablet (800 mg total) by mouth every 8 (eight) hours as needed. for pain  Return in about 3 months (around 10/03/2019).    Rutherford Guys,  MD Primary Care at [facility] [place], [place] [address] Ph.  [phone] Fax [phone]

## 2019-07-05 NOTE — Patient Instructions (Addendum)
TRULICITY  If you have lab work done today you will be contacted with your lab results within the next 2 weeks.  If you have not heard from [place] then please contact us. The fastest way to get your results is to register for My Chart.   IF you received an x-ray today, you will receive an invoice from [facility]. Please contact [facility] at [phone] with questions or concerns regarding your invoice.   IF you received labwork today, you will receive an invoice from [facility]. Please contact [facility] at [phone] with questions or concerns regarding your invoice.   Our billing staff will not be able to assist you with questions regarding bills from these companies.  You will be contacted with the lab results as soon as they are available. The fastest way to get your results is to activate your My Chart account. Instructions are located on the last page of this paperwork. If you have not heard from [place] regarding the results in 2 weeks, please contact this office.

## 2019-09-03 ENCOUNTER — Encounter: Payer: Self-pay | Admitting: Family Medicine

## 2019-09-27 LAB — HM MAMMOGRAPHY

## 2019-09-30 ENCOUNTER — Encounter: Payer: Self-pay | Admitting: Gastroenterology

## 2019-10-04 ENCOUNTER — Other Ambulatory Visit: Payer: Self-pay

## 2019-10-04 ENCOUNTER — Ambulatory Visit (INDEPENDENT_AMBULATORY_CARE_PROVIDER_SITE_OTHER): Payer: Commercial Managed Care - PPO | Admitting: Family Medicine

## 2019-10-04 ENCOUNTER — Encounter: Payer: Self-pay | Admitting: Family Medicine

## 2019-10-04 VITALS — BP 117/81 | HR 81 | Temp 98.1°F | Resp 16 | Ht 61.0 in | Wt 212.2 lb

## 2019-10-04 DIAGNOSIS — E119 Type 2 diabetes mellitus without complications: Secondary | ICD-10-CM | POA: Diagnosis not present

## 2019-10-04 DIAGNOSIS — I10 Essential (primary) hypertension: Secondary | ICD-10-CM

## 2019-10-04 MED ORDER — RYBELSUS 3 MG PO TABS: 3.0000 mg | ORAL_TABLET | Freq: Every day | ORAL | 0 refills | Status: DC

## 2019-10-04 MED ORDER — RYBELSUS 7 MG PO TABS: 7.0000 mg | ORAL_TABLET | Freq: Every day | ORAL | 5 refills | Status: DC

## 2019-10-04 NOTE — Progress Notes (Signed)
4/30/20214:51 PM  [name] 07-22-1970, 49 y.o., female [id number]  Chief Complaint  Patient presents with  . Diabetes    pt is doing well checks her BG about once every other day and states her BG yesterday AM was 139, denies and lightheadedness or dizzyness, denies side effects from medications, does want to discuss new medication she discovered     HPI:   Patient is a 49 y.o. female with past medical history significant for HTN, DM2, OA of kneewho presents today for followup  Last OV Jan 2021 - no changes, LFM  She gets her Td thru work  Has been checking fasting 3 x week  This morning 139 Has changed to stevia, trying to work on diet She would like to try oral GLP1 as has gained weight    Lab Results  Component Value Date   HGBA1C 7.4 (H) 03/28/2019   HGBA1C 7.6 (A) 04/16/2018   HGBA1C 7.2 (H) 06/27/2017   Lab Results  Component Value Date   LDLCALC 103 (H) 03/28/2019   CREATININE 0.79 03/28/2019    Depression screen PHQ 2/9 10/04/2019 07/05/2019 09/06/2018  Decreased Interest 0 0 0  Down, Depressed, Hopeless 1 0 0  PHQ - 2 Score 1 0 0  Some encounter information is confidential and restricted. Go to Review Flowsheets activity to see all data.    Fall Risk  10/04/2019 07/05/2019 09/06/2018 08/30/2018 08/30/2018  Falls in the past year? 0 0 0 0 0  Number falls in past yr: - 0 - 0 0  Injury with Fall? - 0 - 0 0  Follow up Falls evaluation completed - - - -  Some encounter information is confidential and restricted. Go to Review Flowsheets activity to see all data.     Allergies  Allergen Reactions  . Ace Inhibitors Cough    She did have borderline swelling to the tip of her uvula.    Prior to Admission medications   Medication Sig Start Date End Date Taking? Authorizing Provider  cetirizine (ZYRTEC) 10 MG tablet Take 1 tablet (10 mg total) by mouth daily. 04/09/19  Yes Rutherford Guys, MD  ibuprofen (ADVIL) 800 MG tablet Take 1 tablet (800 mg total) by  mouth every 8 (eight) hours as needed. for pain 07/05/19  Yes Rutherford Guys, MD  metFORMIN (GLUCOPHAGE) 500 MG tablet Take 1 tablet (500 mg total) by mouth 2 (two) times daily with a meal. 07/05/19  Yes Rutherford Guys, MD  triamterene-hydrochlorothiazide (MAXZIDE-25) 37.5-25 MG tablet Take 1 tablet by mouth daily. 07/05/19  Yes Rutherford Guys, MD    Past Medical History:  Diagnosis Date  . Anxiety   . Asthma    IN PAST NO RECENT PROBLEMS  . Blood clot in vein 2008   right leg  . Depression   . Epigastric pain   . Gallstones   . Headache(784.0)    Migraines  . Heartburn   . Hypertension   . Iron deficiency anemia     Past Surgical History:  Procedure Laterality Date  . CHOLECYSTECTOMY  10/05/2011   Procedure: LAPAROSCOPIC CHOLECYSTECTOMY WITH INTRAOPERATIVE CHOLANGIOGRAM;  Surgeon: Pedro Earls, MD;  Location: [facility];  Service: General;  Laterality: N/A;  . COLONOSCOPY    . ROBOTIC ASSISTED TOTAL HYSTERECTOMY WITH SALPINGECTOMY Bilateral 10/12/2015   Procedure: ROBOTIC ASSISTED TOTAL HYSTERECTOMY WITH SALPINGECTOMY;  Surgeon: Brien Few, MD;  Location: [facility];  Service: Gynecology;  Laterality: Bilateral;  . TUBAL LIGATION    .  TUBAL REVERSAL      Social History   Tobacco Use  . Smoking status: Never Smoker  . Smokeless tobacco: Never Used  Substance Use Topics  . Alcohol use: Yes    Comment: RARE    Family History  Problem Relation Age of Onset  . Hypertension Mother   . Diabetes Mother   . Cancer Father   . Diabetes Other        Family History  . Colon cancer Neg Hx     Review of Systems  Constitutional: Negative for chills and fever.  Respiratory: Negative for cough and shortness of breath.   Cardiovascular: Negative for chest pain, palpitations and leg swelling.  Gastrointestinal: Negative for abdominal pain, nausea and vomiting.     OBJECTIVE:  Today's Vitals   10/04/19 1633  BP: 117/81  Pulse: 81  Resp: 16  Temp: 98.1 F (36.7 C)    TempSrc: Temporal  SpO2: 100%  Weight: 212 lb 3.2 oz (96.3 kg)  Height: 5\' 1"  (1.549 m)   Body mass index is 40.09 kg/m.  Wt Readings from Last 3 Encounters:  10/04/19 212 lb 3.2 oz (96.3 kg)  07/05/19 209 lb (94.8 kg)  03/28/19 206 lb 9.6 oz (93.7 kg)    Physical Exam Vitals and nursing note reviewed.  Constitutional:      Appearance: She is well-developed.  HENT:     Head: Normocephalic and atraumatic.     Mouth/Throat:     Pharynx: No oropharyngeal exudate.  Eyes:     General: No scleral icterus.    Conjunctiva/sclera: Conjunctivae normal.     Pupils: Pupils are equal, round, and reactive to light.  Cardiovascular:     Rate and Rhythm: Normal rate and regular rhythm.     Heart sounds: Normal heart sounds. No murmur. No friction rub. No gallop.   Pulmonary:     Effort: Pulmonary effort is normal.     Breath sounds: Normal breath sounds. No wheezing or rales.  Musculoskeletal:     Cervical back: Neck supple.  Skin:    General: Skin is warm and dry.  Neurological:     Mental Status: She is alert and oriented to person, place, and time.     No results found for this or any previous visit (from the past 24 hour(s)).  No results found.   ASSESSMENT and PLAN  1. Diabetes mellitus type II, non insulin dependent ([facility]) Labs pending. Starting GLP1. Discussed r/se/b. Discussed LFM - Hemoglobin A1c - Comprehensive metabolic panel  2. Essential hypertension Controlled. Continue current regime.   Other orders - Semaglutide (RYBELSUS) 3 MG TABS; Take 3 mg by mouth daily before breakfast. Take 30 minutes before first meal of the day - Semaglutide (RYBELSUS) 7 MG TABS; Take 7 mg by mouth daily before breakfast. Take 30 minutes before first meal of the day  Return in about 3 months (around 01/03/2020).    Rutherford Guys, MD Primary Care at [facility], [place] [address] Ph.  [phone] Fax [phone]

## 2019-10-04 NOTE — Patient Instructions (Signed)
° ° ° °  If you have lab work done today you will be contacted with your lab results within the next 2 weeks.  If you have not heard from us then please contact us. The fastest way to get your results is to register for My Chart. ° ° °IF you received an x-ray today, you will receive an invoice from [facility]. Please contact [facility] at [phone] with questions or concerns regarding your invoice.  ° °IF you received labwork today, you will receive an invoice from [facility]. Please contact [facility] at [phone] with questions or concerns regarding your invoice.  ° °Our billing staff will not be able to assist you with questions regarding bills from these companies. ° °You will be contacted with the lab results as soon as they are available. The fastest way to get your results is to activate your My Chart account. Instructions are located on the last page of this paperwork. If you have not heard from us regarding the results in 2 weeks, please contact this office. °  ° ° ° °

## 2019-10-05 LAB — COMPREHENSIVE METABOLIC PANEL
ALT: 66 IU/L — ABNORMAL HIGH (ref 0–32)
AST: 47 IU/L — ABNORMAL HIGH (ref 0–40)
Albumin/Globulin Ratio: 1.6 (ref 1.2–2.2)
Albumin: 4.4 g/dL (ref 3.8–4.8)
Alkaline Phosphatase: 73 IU/L (ref 39–117)
BUN/Creatinine Ratio: 17 (ref 9–23)
BUN: 14 mg/dL (ref 6–24)
Bilirubin Total: 0.3 mg/dL (ref 0.0–1.2)
CO2: 25 mmol/L (ref 20–29)
Calcium: 9.8 mg/dL (ref 8.7–10.2)
Chloride: 100 mmol/L (ref 96–106)
Creatinine, Ser: 0.81 mg/dL (ref 0.57–1.00)
GFR calc Af Amer: 99 mL/min/{1.73_m2} (ref >59)
GFR calc non Af Amer: 86 mL/min/{1.73_m2} (ref >59)
Globulin, Total: 2.8 g/dL (ref 1.5–4.5)
Glucose: 149 mg/dL — ABNORMAL HIGH (ref 65–99)
Potassium: 4 mmol/L (ref 3.5–5.2)
Sodium: 141 mmol/L (ref 134–144)
Total Protein: 7.2 g/dL (ref 6.0–8.5)

## 2019-10-05 LAB — HEMOGLOBIN A1C
Est. average glucose Bld gHb Est-mCnc: 183 mg/dL
Hgb A1c MFr Bld: 8 % — ABNORMAL HIGH (ref 4.8–5.6)

## 2019-10-31 ENCOUNTER — Ambulatory Visit: Payer: Commercial Managed Care - PPO | Admitting: Gastroenterology

## 2019-10-31 ENCOUNTER — Ambulatory Visit (INDEPENDENT_AMBULATORY_CARE_PROVIDER_SITE_OTHER): Payer: Commercial Managed Care - PPO | Admitting: Gastroenterology

## 2019-10-31 ENCOUNTER — Encounter: Payer: Self-pay | Admitting: Gastroenterology

## 2019-10-31 DIAGNOSIS — Z800 Family history of malignant neoplasm of digestive organs: Secondary | ICD-10-CM | POA: Diagnosis not present

## 2019-10-31 DIAGNOSIS — R194 Change in bowel habit: Secondary | ICD-10-CM

## 2019-10-31 NOTE — Patient Instructions (Signed)
If you are age 49 or older, your body mass index should be between 23-30. Your There is no height or weight on file to calculate BMI. If this is out of the aforementioned range listed, please consider follow up with your Primary Care Provider.  If you are age 26 or younger, your body mass index should be between 19-25. Your There is no height or weight on file to calculate BMI. If this is out of the aformentioned range listed, please consider follow up with your Primary Care Provider.   It has been recommended to you by your physician that you have a(n) Colonoscopy completed. Per your request, we did not schedule the procedure(s) today. Please contact our office at [phone] should you decide to have the procedure completed. You will be scheduled for a pre-visit and procedure at that time.  It was a pleasure to see you today!  Dr. Loletha Carrow

## 2019-10-31 NOTE — Progress Notes (Signed)
[facility] Consult Note:  History: [name] 10/31/2019  Referring provider: Rutherford Guys, MD  Reason for consult/chief complaint: Change in bowel habits and family history of colon cancer  Subjective  HPI:  This is a very pleasant 49 year old woman self-referred for symptoms as above. She noticed her stool became more frequent and sometimes loose on Metformin, and perhaps somewhat more so after recently starting a GLP-1 agonist for diabetes and weight loss. With that she has noticed a feeling of some tissue protruding with bowel movements and wonders if it may be hemorrhoids.  She has not had rectal bleeding, denies abdominal pain, nausea, vomiting, early satiety or weight loss.  Her father was diagnosed with colorectal cancer in his late 26s, and [name]'s last colonoscopy was with Dr. Deatra Ina in 2010.  ROS:  Review of Systems  She denies chest pain dyspnea or dysuria Periodic headaches Past Medical History: Past Medical History:  Diagnosis Date  . Anxiety   . Asthma    IN PAST NO RECENT PROBLEMS  . Blood clot in vein 2008   right leg  . Depression   . Epigastric pain   . Gallstones   . Headache(784.0)    Migraines  . Heartburn   . Hypertension   . Iron deficiency anemia      Past Surgical History: Past Surgical History:  Procedure Laterality Date  . CHOLECYSTECTOMY  10/05/2011   Procedure: LAPAROSCOPIC CHOLECYSTECTOMY WITH INTRAOPERATIVE CHOLANGIOGRAM;  Surgeon: Pedro Earls, MD;  Location: [facility];  Service: General;  Laterality: N/A;  . COLONOSCOPY    . ROBOTIC ASSISTED TOTAL HYSTERECTOMY WITH SALPINGECTOMY Bilateral 10/12/2015   Procedure: ROBOTIC ASSISTED TOTAL HYSTERECTOMY WITH SALPINGECTOMY;  Surgeon: Brien Few, MD;  Location: [facility];  Service: Gynecology;  Laterality: Bilateral;  . TUBAL LIGATION    . TUBAL REVERSAL       Family History: Family History  Problem Relation Age of Onset  . Hypertension Mother   .  Diabetes Mother   . Cancer Father   . Colon cancer Neg Hx     Social History: Social History   Socioeconomic History  . Marital status: Married    Spouse name: Jenny Reichmann  . Number of children: 3  . Years of education: Not on file  . Highest education level: Not on file  Occupational History  . Occupation: CNA    Comment: Marketing executive: [employer]  Tobacco Use  . Smoking status: Never Smoker  . Smokeless tobacco: Never Used  Substance and Sexual Activity  . Alcohol use: Yes    Comment: RARE  . Drug use: No  . Sexual activity: Yes    Birth control/protection: None  Other Topics Concern  . Not on file  Social History Narrative   2 caffeine drinks daily   Patient does not get regular exercise.   Social Determinants of Health   Financial Resource Strain:   . Difficulty of Paying Living Expenses:   Food Insecurity:   . Worried About Charity fundraiser in the Last Year:   . Arboriculturist in the Last Year:   Transportation Needs:   . Film/video editor (Medical):   Marland Kitchen Lack of Transportation (Non-Medical):   Physical Activity:   . Days of Exercise per Week:   . Minutes of Exercise per Session:   Stress:   . Feeling of Stress :   Social Connections:   . Frequency of Communication with Friends and Family:   .  Frequency of Social Gatherings with Friends and Family:   . Attends Religious Services:   . Active Member of Clubs or Organizations:   . Attends Archivist Meetings:   Marland Kitchen Marital Status:     Allergies: Allergies  Allergen Reactions  . Ace Inhibitors Cough    She did have borderline swelling to the tip of her uvula.    Outpatient Meds: Current Outpatient Medications  Medication Sig Dispense Refill  . cetirizine (ZYRTEC) 10 MG tablet Take 1 tablet (10 mg total) by mouth daily. 30 tablet 11  . ibuprofen (ADVIL) 800 MG tablet Take 1 tablet (800 mg total) by mouth every 8 (eight) hours as needed. for pain 90 tablet 1  . metFORMIN  (GLUCOPHAGE) 500 MG tablet Take 1 tablet (500 mg total) by mouth 2 (two) times daily with a meal. 180 tablet 1  . Semaglutide (RYBELSUS) 3 MG TABS Take 3 mg by mouth daily before breakfast. Take 30 minutes before first meal of the day 30 tablet 0  . [START ON 11/01/2019] Semaglutide (RYBELSUS) 7 MG TABS Take 7 mg by mouth daily before breakfast. Take 30 minutes before first meal of the day 30 tablet 5  . triamterene-hydrochlorothiazide (MAXZIDE-25) 37.5-25 MG tablet Take 1 tablet by mouth daily. 90 tablet 1   No current facility-administered medications for this visit.      ___________________________________________________________________ Objective   Exam:  LMP 09/18/2015    General: Well-appearing  Eyes: sclera anicteric, no redness  ENT: oral mucosa moist without lesions, no cervical or supraclavicular lymphadenopathy  CV: RRR without murmur, S1/S2, no JVD, no peripheral edema  Resp: clear to auscultation bilaterally, normal RR and effort noted  GI: soft, no tenderness, with active bowel sounds. No guarding or palpable organomegaly noted.  Skin; warm and dry, no rash or jaundice noted  Neuro: awake, alert and oriented x 3. Normal gross motor function and fluent speech Rectal exam deferred Labs:  CBC Latest Ref Rng & Units 09/01/2018 01/23/2017 05/09/2016  WBC 4.0 - 10.5 K/uL 6.3 7.9 -  Hemoglobin 12.0 - 15.0 g/dL 12.3 11.5(L) 11.9(L)  Hematocrit 36.0 - 46.0 % 39.3 35.9(L) 35.0(L)  Platelets 150 - 400 K/uL 281 223 -   CMP Latest Ref Rng & Units 10/04/2019 03/28/2019 09/01/2018  Glucose 65 - 99 mg/dL 149(H) 143(H) -  BUN 6 - 24 mg/dL 14 18 -  Creatinine 0.57 - 1.00 mg/dL 0.81 0.79 0.80  Sodium 134 - 144 mmol/L 141 140 -  Potassium 3.5 - 5.2 mmol/L 4.0 3.6 -  Chloride 96 - 106 mmol/L 100 102 -  CO2 20 - 29 mmol/L 25 20 -  Calcium 8.7 - 10.2 mg/dL 9.8 9.7 -  Total Protein 6.0 - 8.5 g/dL 7.2 7.6 -  Total Bilirubin 0.0 - 1.2 mg/dL 0.3 0.3 -  Alkaline Phos 39 - 117 IU/L 73  76 -  AST 0 - 40 IU/L 47(H) 27 -  ALT 0 - 32 IU/L 66(H) 26 -    Hemoglobin A1c 8.0 last month TSH normal October 2020  Other:  COLONOSCOPY PROCEDURE REPORT           PATIENT:  [name], [name]  MR#:  [id number]     BIRTHDATE:  12-27-1970, 8 yrs. old  GENDER:  female           ENDOSCOPIST:  Sandy Salaam. Deatra Ina, MD     Referred by:           PROCEDURE DATE:  05/19/2009  PROCEDURE:  Colonoscopy, Diagnostic     ASA CLASS:  Class I     INDICATIONS:  Elevated Risk Screening, family history of colon     cancer Father           MEDICATIONS:   Fentanyl 100 mcg IV, Versed 10 mg IV           DESCRIPTION OF PROCEDURE:   After the risks benefits and     alternatives of the procedure were thoroughly explained, informed     consent was obtained.  Digital rectal exam was performed and     revealed no abnormalities.   The LB [id number] endoscope     was introduced through the anus and advanced to the cecum, which     was identified by both the appendix and ileocecal valve, without     limitations.  The quality of the prep was good, using MoviPrep.     The instrument was then slowly withdrawn as the colon was fully     examined.     <<PROCEDUREIMAGES>>           FINDINGS:  A normal appearing cecum, ileocecal valve, and     appendiceal orifice were identified. The ascending, hepatic     flexure, transverse, splenic flexure, descending, sigmoid colon,     and rectum appeared unremarkable (see image1, image2, image3,     image4, image8, image10, image11, and image12).   Retroflexed     views in the rectum revealed no abnormalities.    The scope was     then withdrawn from the patient and the procedure completed.           COMPLICATIONS:  None           ENDOSCOPIC IMPRESSION:     1) Normal colon     RECOMMENDATIONS:     1) Given your significant family history of colon cancer, you     should have a repeat colonoscopy in 5 years           REPEAT EXAM:  In 5 year(s) for  Colonoscopy.           ______________________________     Sandy Salaam. Deatra Ina, MD  Assessment: Encounter Diagnoses  Name Primary?  . Family history of colon cancer in father Yes  . Altered bowel habits     Medication induced diarrhea, which she finds manageable.  She was concerned about the feeling of tissue protrusion, which is likely some prolapse of hemorrhoidal tissue.  It will certainly be evaluated at an upcoming colonoscopy for screening due to family history.  She would like to do it in July, was given some potential dates, and plans to contact us soon to set a date and be given instructions.    Thank you for the courtesy of this consult.  Please call me with any questions or concerns.  [name]  CC: Referring provider noted above

## 2019-11-06 ENCOUNTER — Encounter: Payer: Self-pay | Admitting: Gastroenterology

## 2019-12-06 ENCOUNTER — Encounter: Payer: Self-pay | Admitting: Family Medicine

## 2019-12-06 NOTE — Telephone Encounter (Signed)
Responded to in a different message

## 2019-12-09 ENCOUNTER — Encounter: Payer: Self-pay | Admitting: Family Medicine

## 2019-12-12 ENCOUNTER — Ambulatory Visit (AMBULATORY_SURGERY_CENTER): Payer: Self-pay

## 2019-12-12 ENCOUNTER — Other Ambulatory Visit: Payer: Self-pay

## 2019-12-12 VITALS — Ht 61.0 in | Wt 209.0 lb

## 2019-12-12 DIAGNOSIS — Z800 Family history of malignant neoplasm of digestive organs: Secondary | ICD-10-CM

## 2019-12-12 NOTE — Progress Notes (Signed)
No egg or soy allergy known to patient  No issues with past sedation with any surgeries  or procedures, no intubation problems  No diet pills per patient No home 02 use per patient  No blood thinners per patient  Pt denies issues with constipation  No A fib or A flutter  EMMI video sent to pt's e mail  COVID 19 guidelines implemented in [place] today   Pt vaccinated for covid  Due to the COVID-19 pandemic we are asking patients to follow these guidelines. Please only bring one care partner. Please be aware that your care partner may wait in the car in the parking lot or if they feel like they will be too hot to wait in the car, they may wait in the lobby on the 4th floor. All care partners are required to wear a mask the entire time (we do not have any that we can provide them), they need to practice social distancing, and we will do a Covid check for all patient's and care partners when you arrive. Also we will check their temperature and your temperature. If the care partner waits in their car they need to stay in the parking lot the entire time and we will call them on their cell phone when the patient is ready for discharge so they can bring the car to the front of the building. Also all patient's will need to wear a mask into building.

## 2019-12-19 ENCOUNTER — Encounter: Payer: Self-pay | Admitting: Gastroenterology

## 2019-12-30 ENCOUNTER — Encounter: Payer: Self-pay | Admitting: Gastroenterology

## 2019-12-30 ENCOUNTER — Ambulatory Visit (AMBULATORY_SURGERY_CENTER): Payer: Commercial Managed Care - PPO | Admitting: Gastroenterology

## 2019-12-30 ENCOUNTER — Other Ambulatory Visit: Payer: Self-pay

## 2019-12-30 VITALS — BP 131/90 | HR 75 | Temp 97.6°F | Resp 14 | Ht 61.0 in | Wt 209.0 lb

## 2019-12-30 DIAGNOSIS — R197 Diarrhea, unspecified: Secondary | ICD-10-CM | POA: Diagnosis present

## 2019-12-30 DIAGNOSIS — K5730 Diverticulosis of large intestine without perforation or abscess without bleeding: Secondary | ICD-10-CM | POA: Diagnosis not present

## 2019-12-30 DIAGNOSIS — Z800 Family history of malignant neoplasm of digestive organs: Secondary | ICD-10-CM

## 2019-12-30 MED ORDER — SODIUM CHLORIDE 0.9 % IV SOLN: 500.0000 mL | Freq: Once | INTRAVENOUS | Status: DC

## 2019-12-30 NOTE — Progress Notes (Signed)
Pt's states no medical or surgical changes since previsit or office visit.  CW - vitals 

## 2019-12-30 NOTE — Op Note (Addendum)
[facility] Patient Name: [name] Procedure Date: 12/30/2019 10:17 AM MRN: [id number] Endoscopist: Mallie Mussel L. Loletha Carrow , MD Age: 49 Referring MD:  Date of Birth: August 04, 1970 Gender: Female Account #: [id number] Procedure:                Colonoscopy Indications:              Screening in patient at increased risk: Colorectal                            cancer in father 43 or older, Incidental diarrhea                            noted Medicines:                Monitored Anesthesia Care Procedure:                Pre-Anesthesia Assessment:                           - Prior to the procedure, a History and Physical                            was performed, and patient medications and                            allergies were reviewed. The patient's tolerance of                            previous anesthesia was also reviewed. The risks                            and benefits of the procedure and the sedation                            options and risks were discussed with the patient.                            All questions were answered, and informed consent                            was obtained. Prior Anticoagulants: The patient has                            taken no previous anticoagulant or antiplatelet                            agents. ASA Grade Assessment: II - A patient with                            mild systemic disease. After reviewing the risks                            and benefits, the patient was deemed in  satisfactory condition to undergo the procedure.                           After obtaining informed consent, the colonoscope                            was passed under direct vision. Throughout the                            procedure, the patient's blood pressure, pulse, and                            oxygen saturations were monitored continuously. The                            Colonoscope was introduced through the anus and                             advanced to the the cecum, identified by                            appendiceal orifice and ileocecal valve. The                            colonoscopy was performed without difficulty. The                            patient tolerated the procedure well. The quality                            of the bowel preparation was good after lavage. The                            ileocecal valve, appendiceal orifice, and rectum                            were photographed. The bowel preparation used was                            Miralax. Scope In: 10:37:23 AM Scope Out: 10:53:22 AM Scope Withdrawal Time: 0 hours 11 minutes 46 seconds  Total Procedure Duration: 0 hours 15 minutes 59 seconds  Findings:                 The perianal and digital rectal examinations were                            normal except for redundant skin folds.                           Normal mucosa was found in the entire colon.                            Biopsies for histology were taken with a cold  forceps from the right colon and left colon for                            evaluation of microscopic colitis.                           Multiple diverticula were found in the left colon                            and right colon.                           The exam was otherwise without abnormality on                            direct and retroflexion views. Complications:            No immediate complications. Estimated Blood Loss:     Estimated blood loss: none. Estimated blood loss                            was minimal. Impression:               - Normal mucosa in the entire examined colon.                            Biopsied.                           - Diverticulosis in the left colon and in the right                            colon.                           - The examination was otherwise normal on direct                            and retroflexion  views. Recommendation:           - Patient has a contact number available for                            emergencies. The signs and symptoms of potential                            delayed complications were discussed with the                            patient. Return to normal activities tomorrow.                            Written discharge instructions were provided to the                            patient.                           -  Resume previous diet.                           - Continue present medications.                           - Await pathology results. If negative for                            microscopic colitis, diarrhea is most likely due to                            metformin and/or GLP-1 agonist.                           - Repeat colonoscopy in 5 years for screening                            purposes. (Suprep or Plenvu for next exam) Winston Misner L. Loletha Carrow, MD 12/30/2019 10:58:51 AM This report has been signed electronically.

## 2019-12-30 NOTE — Progress Notes (Signed)
Called to room to assist during endoscopic procedure.  Patient ID and intended procedure confirmed with present staff. Received instructions for my participation in the procedure from the performing physician.  

## 2019-12-30 NOTE — Progress Notes (Signed)
To PACU, VSS. Report to [email or web address].tb 

## 2019-12-30 NOTE — Patient Instructions (Signed)
Handouts given for diverticulosis and high fiber diet.  YOU HAD AN ENDOSCOPIC PROCEDURE TODAY AT THE [facility]:   Refer to the procedure report that was given to you for any specific questions about what was found during the examination.  If the procedure report does not answer your questions, please call your gastroenterologist to clarify.  If you requested that your care partner not be given the details of your procedure findings, then the procedure report has been included in a sealed envelope for you to review at your convenience later.  YOU SHOULD EXPECT: Some feelings of bloating in the abdomen. Passage of more gas than usual.  Walking can help get rid of the air that was put into your GI tract during the procedure and reduce the bloating. If you had a lower endoscopy (such as a colonoscopy or flexible sigmoidoscopy) you may notice spotting of blood in your stool or on the toilet paper. If you underwent a bowel prep for your procedure, you may not have a normal bowel movement for a few days.  Please Note:  You might notice some irritation and congestion in your nose or some drainage.  This is from the oxygen used during your procedure.  There is no need for concern and it should clear up in a day or so.  SYMPTOMS TO REPORT IMMEDIATELY:   Following lower endoscopy (colonoscopy or flexible sigmoidoscopy):  Excessive amounts of blood in the stool  Significant tenderness or worsening of abdominal pains  Swelling of the abdomen that is new, acute  Fever of 100F or higher  For urgent or emergent issues, a gastroenterologist can be reached at any hour by calling [phone]. Do not use MyChart messaging for urgent concerns.    DIET:  We do recommend a small meal at first, but then you may proceed to your regular diet.  Drink plenty of fluids but you should avoid alcoholic beverages for 24 hours.  ACTIVITY:  You should plan to take it easy for the rest of today and you should  NOT DRIVE or use heavy machinery until tomorrow (because of the sedation medicines used during the test).    FOLLOW UP: Our staff will call the number listed on your records 48-72 hours following your procedure to check on you and address any questions or concerns that you may have regarding the information given to you following your procedure. If we do not reach you, we will leave a message.  We will attempt to reach you two times.  During this call, we will ask if you have developed any symptoms of COVID 19. If you develop any symptoms (ie: fever, flu-like symptoms, shortness of breath, cough etc.) before then, please call [phone].  If you test positive for Covid 19 in the 2 weeks post procedure, please call and report this information to us.    If any biopsies were taken you will be contacted by phone or by letter within the next 1-3 weeks.  Please call us at [phone] if you have not heard about the biopsies in 3 weeks.    SIGNATURES/CONFIDENTIALITY: You and/or your care partner have signed paperwork which will be entered into your electronic medical record.  These signatures attest to the fact that that the information above on your After Visit Summary has been reviewed and is understood.  Full responsibility of the confidentiality of this discharge information lies with you and/or your care-partner. 

## 2020-01-01 ENCOUNTER — Telehealth: Payer: Self-pay | Admitting: *Deleted

## 2020-01-01 ENCOUNTER — Encounter: Payer: Self-pay | Admitting: Gastroenterology

## 2020-01-01 NOTE — Telephone Encounter (Signed)
°  Follow up Call-  Call back number 12/30/2019  Post procedure Call Back phone  # [phone]  Permission to leave phone message Yes  Some recent data might be hidden     Patient questions:  Do you have a fever, pain , or abdominal swelling? No. Pain Score  0 *  Have you tolerated food without any problems? Yes.    Have you been able to return to your normal activities? Yes.    Do you have any questions about your discharge instructions: Diet   No. Medications  No. Follow up visit  No.  Do you have questions or concerns about your Care? No.  Actions: * If pain score is 4 or above: 1. No action needed, pain <4.Have you developed a fever since your procedure? no  2.   Have you had an respiratory symptoms (SOB or cough) since your procedure? no  3.   Have you tested positive for COVID 19 since your procedure no  4.   Have you had any family members/close contacts diagnosed with the COVID 19 since your procedure?  no   If yes to any of these questions please route to Joylene John, RN and Erenest Rasher, RN

## 2020-01-03 ENCOUNTER — Other Ambulatory Visit: Payer: Self-pay

## 2020-01-03 ENCOUNTER — Encounter: Payer: Self-pay | Admitting: Family Medicine

## 2020-01-03 ENCOUNTER — Ambulatory Visit (INDEPENDENT_AMBULATORY_CARE_PROVIDER_SITE_OTHER): Payer: Commercial Managed Care - PPO | Admitting: Family Medicine

## 2020-01-03 VITALS — BP 122/76 | HR 90 | Temp 98.9°F | Ht 61.0 in | Wt 211.0 lb

## 2020-01-03 DIAGNOSIS — E119 Type 2 diabetes mellitus without complications: Secondary | ICD-10-CM | POA: Diagnosis not present

## 2020-01-03 DIAGNOSIS — I10 Essential (primary) hypertension: Secondary | ICD-10-CM | POA: Diagnosis not present

## 2020-01-03 MED ORDER — RYBELSUS 14 MG PO TABS: 14.0000 mg | ORAL_TABLET | Freq: Every morning | ORAL | 4 refills | Status: AC

## 2020-01-03 NOTE — Progress Notes (Signed)
7/30/20215:18 PM  [name] 12/25/1970, 49 y.o., female [id number]  Chief Complaint  Patient presents with  . Diabetes    prefers to fasting labs, making nv for nx wk    HPI:   Patient is a 49 y.o. female with past medical history significant for HTN, DM2, OA of kne[place] presents today for followup  Last OV April 2021 - started glp1 Tolerating glp1, feeling that it has increased her appetite Has been cutting back on sodas, walking more Denies any low cbgs  Lab Results  Component Value Date   HGBA1C 8.0 (H) 10/04/2019   HGBA1C 7.4 (H) 03/28/2019   HGBA1C 7.6 (A) 04/16/2018   Lab Results  Component Value Date   LDLCALC 103 (H) 03/28/2019   CREATININE 0.81 10/04/2019     Depression screen PHQ 2/9 01/03/2020 10/04/2019 07/05/2019  Decreased Interest 0 0 0  Down, Depressed, Hopeless 0 1 0  PHQ - 2 Score 0 1 0  Some encounter information is confidential and restricted. Go to Review Flowsheets activity to see all data.    Fall Risk  01/03/2020 10/04/2019 07/05/2019 09/06/2018 08/30/2018  Falls in the past year? 0 0 0 0 0  Number falls in past yr: 0 - 0 - 0  Injury with Fall? 0 - 0 - 0  Follow up - Falls evaluation completed - - -  Some encounter information is confidential and restricted. Go to Review Flowsheets activity to see all data.     Allergies  Allergen Reactions  . Ace Inhibitors Cough    She did have borderline swelling to the tip of her uvula.    Prior to Admission medications   Medication Sig Start Date End Date Taking? Authorizing Provider  Betamethasone Sodium Phosphate 6 MG/ML SOLN inject 2:4 mixture of Lidocaine 2% and Betamethasone sodium into RIGHT KNEE 08/12/19  Yes [provider]  cetirizine (ZYRTEC) 10 MG tablet Take 1 tablet (10 mg total) by mouth daily. Patient taking differently: Take 10 mg by mouth daily as needed.  04/09/19  Yes Rutherford Guys, MD  ibuprofen (ADVIL) 800 MG tablet Take 1 tablet (800 mg total) by mouth every 8  (eight) hours as needed. for pain 07/05/19  Yes Rutherford Guys, MD  metFORMIN (GLUCOPHAGE) 500 MG tablet Take 1 tablet (500 mg total) by mouth 2 (two) times daily with a meal. 07/05/19  Yes Rutherford Guys, MD  Semaglutide (RYBELSUS) 3 MG TABS Take 3 mg by mouth daily before breakfast. Take 30 minutes before first meal of the day 10/04/19  Yes Rutherford Guys, MD  Semaglutide (RYBELSUS) 7 MG TABS Take 7 mg by mouth daily before breakfast. Take 30 minutes before first meal of the day Patient taking differently: Take 14 mg by mouth daily before breakfast. Take 30 minutes before first meal of the day 11/01/19  Yes Rutherford Guys, MD  triamterene-hydrochlorothiazide (MAXZIDE-25) 37.5-25 MG tablet Take 1 tablet by mouth daily. 07/05/19  Yes Rutherford Guys, MD    Past Medical History:  Diagnosis Date  . Anxiety   . Asthma    IN PAST NO RECENT PROBLEMS  . Blood clot in vein 2008   right leg  . Depression   . Epigastric pain   . Gallstones   . GERD (gastroesophageal reflux disease)   . Headache(784.0)    Migraines  . Heartburn   . Hypertension   . Iron deficiency anemia     Past Surgical History:  Procedure Laterality Date  .  CHOLECYSTECTOMY  10/05/2011   Procedure: LAPAROSCOPIC CHOLECYSTECTOMY WITH INTRAOPERATIVE CHOLANGIOGRAM;  Surgeon: Pedro Earls, MD;  Location: [facility];  Service: General;  Laterality: N/A;  . COLONOSCOPY  2010   Dr Deatra Ina- normal-[facility]  . ROBOTIC ASSISTED TOTAL HYSTERECTOMY WITH SALPINGECTOMY Bilateral 10/12/2015   Procedure: ROBOTIC ASSISTED TOTAL HYSTERECTOMY WITH SALPINGECTOMY;  Surgeon: Brien Few, MD;  Location: [facility];  Service: Gynecology;  Laterality: Bilateral;  . TUBAL LIGATION    . TUBAL REVERSAL      Social History   Tobacco Use  . Smoking status: Never Smoker  . Smokeless tobacco: Never Used  Substance Use Topics  . Alcohol use: Yes    Comment: RARE    Family History  Problem Relation Age of Onset  . Hypertension Mother   . Diabetes  Mother   . Cancer Father   . Colon cancer Father   . Colon polyps Neg Hx   . Esophageal cancer Neg Hx   . Rectal cancer Neg Hx   . Stomach cancer Neg Hx     ROS Per hpi  OBJECTIVE:  Today's Vitals   01/03/20 1627  BP: 122/76  Pulse: 90  Temp: 98.9 F (37.2 C)  SpO2: 96%  Weight: (!) 211 lb (95.7 kg)  Height: 5' 1"  (1.549 m)   Body mass index is 39.87 kg/m.   Wt Readings from Last 3 Encounters:  01/03/20 (!) 211 lb (95.7 kg)  12/30/19 (!) 209 lb (94.8 kg)  12/12/19 209 lb (94.8 kg)     Physical Exam Vitals and nursing note reviewed.  Constitutional:      Appearance: She is well-developed.  HENT:     Head: Normocephalic and atraumatic.  Eyes:     General: No scleral icterus.    Conjunctiva/sclera: Conjunctivae normal.     Pupils: Pupils are equal, round, and reactive to light.  Pulmonary:     Effort: Pulmonary effort is normal.  Musculoskeletal:     Cervical back: Neck supple.  Skin:    General: Skin is warm and dry.  Neurological:     Mental Status: She is alert and oriented to person, place, and time.      No results found for this or any previous visit (from the past 24 hour(s)).  No results found.   ASSESSMENT and PLAN  1. Diabetes mellitus type II, non insulin dependent (HCC) Increasing GLP1 to max dose. Goal for glycemic control and weight loss. Consider changing to injectable and titration up to 2.37mg - Lipid panel; Future - TSH; Future - CMP14+EGFR; Future - Hemoglobin A1c; Future  2. Essential hypertension Controlled. Continue current regime.  - Lipid panel; Future - TSH; Future - CMP14+EGFR; Future  Other orders - Semaglutide (RYBELSUS) 14 MG TABS; Take 14 mg by mouth in the morning.  Return in about 4 weeks (around 01/31/2020).    IRutherford Guys MD [facility] [place] [id number].  [phone] [phone]

## 2020-01-03 NOTE — Patient Instructions (Signed)
° ° ° °  If you have lab work done today you will be contacted with your lab results within the next 2 weeks.  If you have not heard from us then please contact us. The fastest way to get your results is to register for My Chart. ° ° °IF you received an x-ray today, you will receive an invoice from [facility]. Please contact [facility] at [phone] with questions or concerns regarding your invoice.  ° °IF you received labwork today, you will receive an invoice from [facility]. Please contact [facility] at [phone] with questions or concerns regarding your invoice.  ° °Our billing staff will not be able to assist you with questions regarding bills from these companies. ° °You will be contacted with the lab results as soon as they are available. The fastest way to get your results is to activate your My Chart account. Instructions are located on the last page of this paperwork. If you have not heard from us regarding the results in 2 weeks, please contact this office. °  ° ° ° °

## 2020-03-08 ENCOUNTER — Other Ambulatory Visit: Payer: Self-pay | Admitting: Family Medicine

## 2020-03-08 DIAGNOSIS — I10 Essential (primary) hypertension: Secondary | ICD-10-CM

## 2020-03-08 NOTE — Telephone Encounter (Signed)
Requested Prescriptions  Pending Prescriptions Disp Refills  . triamterene-hydrochlorothiazide (MAXZIDE-25) 37.5-25 MG tablet [Pharmacy Med Name: TRIAMTERENE-HCTZ 37.5-25 MG TB] 90 tablet 1    Sig: TAKE 1 TABLET BY MOUTH EVERY DAY     Cardiovascular: Diuretic Combos Passed - 03/08/2020  8:59 AM      Passed - K in normal range and within 360 days    Potassium  Date Value Ref Range Status  10/04/2019 4.0 3.5 - 5.2 mmol/L Final         Passed - Na in normal range and within 360 days    Sodium  Date Value Ref Range Status  10/04/2019 141 134 - 144 mmol/L Final         Passed - Cr in normal range and within 360 days    Creat  Date Value Ref Range Status  03/23/2015 0.64 0.50 - 1.10 mg/dL Final   Creatinine, Ser  Date Value Ref Range Status  10/04/2019 0.81 0.57 - 1.00 mg/dL Final         Passed - Ca in normal range and within 360 days    Calcium  Date Value Ref Range Status  10/04/2019 9.8 8.7 - 10.2 mg/dL Final   Calcium, Ion  Date Value Ref Range Status  05/09/2016 1.18 1.15 - 1.40 mmol/L Final         Passed - Last BP in normal range    BP Readings from Last 1 Encounters:  01/03/20 122/76         Passed - Valid encounter within last 6 months    Recent Outpatient Visits          2 months ago Diabetes mellitus type II, non insulin dependent ([place])   [facility], Lilia Argue, MD   5 months ago Diabetes mellitus type II, non insulin dependent [facility])   [facility], Lilia Argue, MD   8 months ago Diabetes mellitus type II, non insulin dependent [facility])   [facility], Lilia Argue, MD   11 months ago Essential hypertension   [facility], Lilia Argue, MD   1 year ago Hypokalemia   [facility], Lilia Argue, MD             . metFORMIN (GLUCOPHAGE) 500 MG tablet [Pharmacy Med Name: METFORMIN HCL 500 MG TABLET] 180 tablet 1    Sig: TAKE 1 TABLET (500 MG TOTAL) BY MOUTH 2 (TWO) TIMES DAILY WITH A  MEAL.     Endocrinology:  Diabetes - Biguanides Failed - 03/08/2020  8:59 AM      Failed - HBA1C is between 0 and 7.9 and within 180 days    Hgb A1c MFr Bld  Date Value Ref Range Status  10/04/2019 8.0 (H) 4.8 - 5.6 % Final    Comment:             Prediabetes: 5.7 - 6.4          Diabetes: >6.4          Glycemic control for adults with diabetes: <7.0          Passed - Cr in normal range and within 360 days    Creat  Date Value Ref Range Status  03/23/2015 0.64 0.50 - 1.10 mg/dL Final   Creatinine, Ser  Date Value Ref Range Status  10/04/2019 0.81 0.57 - 1.00 mg/dL Final         Passed - eGFR in  normal range and within 360 days    GFR, Est African American  Date Value Ref Range Status  03/18/2015 >89 >=60 mL/min Final   GFR calc Af Amer  Date Value Ref Range Status  10/04/2019 99 >59 mL/min/1.73 Final    Comment:    **[facility] currently reports eGFR in compliance with the current**   recommendations of the [employer]. [facility] will   update reporting as new guidelines are published from the NKF-ASN   Task force.    GFR, Est Non African American  Date Value Ref Range Status  03/18/2015 >89 >=60 mL/min Final    Comment:      The estimated GFR is a calculation valid for adults (>=79 years old) that uses the CKD-EPI algorithm to adjust for age and sex. It is   not to be used for children, pregnant women, hospitalized patients,    patients on dialysis, or with rapidly changing kidney function. According to the NKDEP, eGFR >89 is normal, 60-89 shows mild impairment, 30-59 shows moderate impairment, 15-29 shows severe impairment and <15 is ESRD.      GFR calc non Af Amer  Date Value Ref Range Status  10/04/2019 86 >59 mL/min/1.73 Final   GFR  Date Value Ref Range Status  12/07/2010 136.79 >60.00 mL/min Final         Passed - Valid encounter within last 6 months    Recent Outpatient Visits          2 months ago Diabetes mellitus type II, non  insulin dependent ([place])   [facility], Lilia Argue, MD   5 months ago Diabetes mellitus type II, non insulin dependent [facility])   [facility], Lilia Argue, MD   8 months ago Diabetes mellitus type II, non insulin dependent [facility])   [facility], Lilia Argue, MD   11 months ago Essential hypertension   [facility], Lilia Argue, MD   1 year ago Hypokalemia   [facility], Lilia Argue, MD
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
## 2021-06-11 DIAGNOSIS — I10 Essential (primary) hypertension: Secondary | ICD-10-CM | POA: Diagnosis not present

## 2021-06-11 DIAGNOSIS — E1165 Type 2 diabetes mellitus with hyperglycemia: Secondary | ICD-10-CM | POA: Diagnosis not present

## 2021-09-24 DIAGNOSIS — I10 Essential (primary) hypertension: Secondary | ICD-10-CM | POA: Diagnosis not present

## 2021-09-24 DIAGNOSIS — E785 Hyperlipidemia, unspecified: Secondary | ICD-10-CM | POA: Diagnosis not present

## 2021-09-24 DIAGNOSIS — Z0100 Encounter for examination of eyes and vision without abnormal findings: Secondary | ICD-10-CM | POA: Diagnosis not present

## 2021-09-24 DIAGNOSIS — E1165 Type 2 diabetes mellitus with hyperglycemia: Secondary | ICD-10-CM | POA: Diagnosis not present

## 2021-10-08 DIAGNOSIS — Z1231 Encounter for screening mammogram for malignant neoplasm of breast: Secondary | ICD-10-CM | POA: Diagnosis not present

## 2022-01-18 DIAGNOSIS — N898 Other specified noninflammatory disorders of vagina: Secondary | ICD-10-CM | POA: Diagnosis not present

## 2022-03-16 DIAGNOSIS — N959 Unspecified menopausal and perimenopausal disorder: Secondary | ICD-10-CM | POA: Diagnosis not present

## 2022-03-16 DIAGNOSIS — Z6837 Body mass index (BMI) 37.0-37.9, adult: Secondary | ICD-10-CM | POA: Diagnosis not present

## 2022-03-16 DIAGNOSIS — Z01419 Encounter for gynecological examination (general) (routine) without abnormal findings: Secondary | ICD-10-CM | POA: Diagnosis not present

## 2022-04-01 DIAGNOSIS — E785 Hyperlipidemia, unspecified: Secondary | ICD-10-CM | POA: Diagnosis not present

## 2022-04-01 DIAGNOSIS — Z23 Encounter for immunization: Secondary | ICD-10-CM | POA: Diagnosis not present

## 2022-04-01 DIAGNOSIS — Z0000 Encounter for general adult medical examination without abnormal findings: Secondary | ICD-10-CM | POA: Diagnosis not present

## 2022-04-01 DIAGNOSIS — E119 Type 2 diabetes mellitus without complications: Secondary | ICD-10-CM | POA: Diagnosis not present

## 2022-04-01 DIAGNOSIS — E1165 Type 2 diabetes mellitus with hyperglycemia: Secondary | ICD-10-CM | POA: Diagnosis not present

## 2022-04-15 DIAGNOSIS — E781 Pure hyperglyceridemia: Secondary | ICD-10-CM | POA: Diagnosis not present

## 2022-09-30 DIAGNOSIS — I10 Essential (primary) hypertension: Secondary | ICD-10-CM | POA: Diagnosis not present

## 2022-09-30 DIAGNOSIS — E669 Obesity, unspecified: Secondary | ICD-10-CM | POA: Diagnosis not present

## 2022-09-30 DIAGNOSIS — E1165 Type 2 diabetes mellitus with hyperglycemia: Secondary | ICD-10-CM | POA: Diagnosis not present

## 2022-09-30 DIAGNOSIS — E785 Hyperlipidemia, unspecified: Secondary | ICD-10-CM | POA: Diagnosis not present

## 2022-10-21 DIAGNOSIS — Z1231 Encounter for screening mammogram for malignant neoplasm of breast: Secondary | ICD-10-CM | POA: Diagnosis not present

## 2022-12-01 DIAGNOSIS — K625 Hemorrhage of anus and rectum: Secondary | ICD-10-CM | POA: Diagnosis not present

## 2022-12-01 DIAGNOSIS — E782 Mixed hyperlipidemia: Secondary | ICD-10-CM | POA: Diagnosis not present

## 2022-12-01 DIAGNOSIS — Z1211 Encounter for screening for malignant neoplasm of colon: Secondary | ICD-10-CM | POA: Diagnosis not present

## 2022-12-01 DIAGNOSIS — I10 Essential (primary) hypertension: Secondary | ICD-10-CM | POA: Diagnosis not present

## 2022-12-01 DIAGNOSIS — K9089 Other intestinal malabsorption: Secondary | ICD-10-CM | POA: Diagnosis not present

## 2022-12-01 DIAGNOSIS — E119 Type 2 diabetes mellitus without complications: Secondary | ICD-10-CM | POA: Diagnosis not present

## 2023-01-30 DIAGNOSIS — Z83719 Family history of colon polyps, unspecified: Secondary | ICD-10-CM | POA: Diagnosis not present

## 2023-01-30 DIAGNOSIS — D125 Benign neoplasm of sigmoid colon: Secondary | ICD-10-CM | POA: Diagnosis not present

## 2023-01-30 DIAGNOSIS — K6389 Other specified diseases of intestine: Secondary | ICD-10-CM | POA: Diagnosis not present

## 2023-01-30 DIAGNOSIS — Z1211 Encounter for screening for malignant neoplasm of colon: Secondary | ICD-10-CM | POA: Diagnosis not present

## 2023-01-30 DIAGNOSIS — K648 Other hemorrhoids: Secondary | ICD-10-CM | POA: Diagnosis not present

## 2023-01-30 DIAGNOSIS — K644 Residual hemorrhoidal skin tags: Secondary | ICD-10-CM | POA: Diagnosis not present

## 2023-01-30 DIAGNOSIS — K635 Polyp of colon: Secondary | ICD-10-CM | POA: Diagnosis not present

## 2023-01-30 DIAGNOSIS — I10 Essential (primary) hypertension: Secondary | ICD-10-CM | POA: Diagnosis not present

## 2023-01-30 DIAGNOSIS — K559 Vascular disorder of intestine, unspecified: Secondary | ICD-10-CM | POA: Diagnosis not present

## 2023-01-30 DIAGNOSIS — K5730 Diverticulosis of large intestine without perforation or abscess without bleeding: Secondary | ICD-10-CM | POA: Diagnosis not present

## 2023-03-17 DIAGNOSIS — M5459 Other low back pain: Secondary | ICD-10-CM | POA: Diagnosis not present

## 2023-03-17 DIAGNOSIS — M62838 Other muscle spasm: Secondary | ICD-10-CM | POA: Diagnosis not present

## 2023-03-31 ENCOUNTER — Other Ambulatory Visit: Payer: Self-pay | Admitting: Internal Medicine

## 2023-03-31 ENCOUNTER — Ambulatory Visit
Admission: RE | Admit: 2023-03-31 | Discharge: 2023-03-31 | Disposition: A | Discharge status: Home / Self Care | Payer: BC Managed Care – PPO | Source: Ambulatory Visit | Attending: Internal Medicine | Admitting: Internal Medicine

## 2023-03-31 DIAGNOSIS — M25552 Pain in left hip: Secondary | ICD-10-CM | POA: Diagnosis not present
